# Patient Record
Sex: Female | Born: 1955
Health system: Southern US, Community
[De-identification: ages and names within clinical notes are randomized; demographics above are authoritative.]

## PROBLEM LIST (undated history)

## (undated) DIAGNOSIS — K760 Fatty (change of) liver, not elsewhere classified: Secondary | ICD-10-CM

## (undated) DIAGNOSIS — S92902A Unspecified fracture of left foot, initial encounter for closed fracture: Secondary | ICD-10-CM

## (undated) DIAGNOSIS — E1165 Type 2 diabetes mellitus with hyperglycemia: Secondary | ICD-10-CM

## (undated) DIAGNOSIS — K219 Gastro-esophageal reflux disease without esophagitis: Secondary | ICD-10-CM

## (undated) DIAGNOSIS — E785 Hyperlipidemia, unspecified: Secondary | ICD-10-CM

## (undated) HISTORY — PX: BREAST BIOPSY: SHX20

## (undated) HISTORY — DX: Gastro-esophageal reflux disease without esophagitis: K21.9

## (undated) HISTORY — DX: Hyperlipidemia, unspecified: E78.5

## (undated) HISTORY — PX: TONSILLECTOMY: SUR1361

## (undated) HISTORY — DX: Type 2 diabetes mellitus with hyperglycemia: E11.65

## (undated) HISTORY — DX: Fatty (change of) liver, not elsewhere classified: K76.0

## (undated) HISTORY — DX: Unspecified fracture of left foot, initial encounter for closed fracture: S92.902A

---

## 1999-11-27 HISTORY — PX: BACK SURGERY: SHX140

## 2007-11-27 DIAGNOSIS — S92902A Unspecified fracture of left foot, initial encounter for closed fracture: Secondary | ICD-10-CM

## 2007-11-27 HISTORY — DX: Unspecified fracture of left foot, initial encounter for closed fracture: S92.902A

## 2009-04-26 ENCOUNTER — Ambulatory Visit: Payer: Self-pay | Admitting: Family Medicine

## 2009-04-26 DIAGNOSIS — K219 Gastro-esophageal reflux disease without esophagitis: Secondary | ICD-10-CM | POA: Insufficient documentation

## 2009-04-26 DIAGNOSIS — F329 Major depressive disorder, single episode, unspecified: Secondary | ICD-10-CM

## 2009-04-26 DIAGNOSIS — E785 Hyperlipidemia, unspecified: Secondary | ICD-10-CM | POA: Insufficient documentation

## 2009-04-26 DIAGNOSIS — G43009 Migraine without aura, not intractable, without status migrainosus: Secondary | ICD-10-CM | POA: Insufficient documentation

## 2009-04-26 DIAGNOSIS — F3289 Other specified depressive episodes: Secondary | ICD-10-CM | POA: Insufficient documentation

## 2009-04-27 ENCOUNTER — Ambulatory Visit: Payer: Self-pay | Admitting: Family Medicine

## 2009-04-27 LAB — CONVERTED CEMR LAB
LDL Cholesterol: 115 mg/dL
LDL Cholesterol: 115 mg/dL
LDL Cholesterol: 115 mg/dL
LDL Cholesterol: 115 mg/dL
LDL Cholesterol: 115 mg/dL

## 2009-05-03 LAB — CONVERTED CEMR LAB
ALT: 70 units/L — ABNORMAL HIGH (ref 0–35)
Basophils Relative: 2.1 % (ref 0.0–3.0)
Bilirubin, Direct: 0 mg/dL (ref 0.0–0.3)
Chloride: 109 meq/L (ref 96–112)
Cholesterol: 195 mg/dL (ref 0–200)
Creatinine, Ser: 0.6 mg/dL (ref 0.4–1.2)
Eosinophils Relative: 3.1 % (ref 0.0–5.0)
GFR calc non Af Amer: 110.71 mL/min (ref >60)
HCT: 38.2 % (ref 36.0–46.0)
HDL: 44.9 mg/dL (ref >39.00)
Hemoglobin: 12.9 g/dL (ref 12.0–15.0)
Lymphs Abs: 2.2 10*3/uL (ref 0.7–4.0)
MCV: 91.2 fL (ref 78.0–100.0)
Monocytes Absolute: 0.4 10*3/uL (ref 0.1–1.0)
Monocytes Relative: 5.8 % (ref 3.0–12.0)
Neutro Abs: 4.6 10*3/uL (ref 1.4–7.7)
Platelets: 380 10*3/uL (ref 150.0–400.0)
Potassium: 4.3 meq/L (ref 3.5–5.1)
TSH: 1.48 microintl units/mL (ref 0.35–5.50)
Total Bilirubin: 0.8 mg/dL (ref 0.3–1.2)
Total CHOL/HDL Ratio: 4
VLDL: 49.8 mg/dL — ABNORMAL HIGH (ref 0.0–40.0)
Vitamin B-12: 492 pg/mL (ref 211–911)
WBC: 7.6 10*3/uL (ref 4.5–10.5)

## 2009-05-05 ENCOUNTER — Encounter: Payer: Self-pay | Admitting: Family Medicine

## 2009-05-13 ENCOUNTER — Encounter (INDEPENDENT_AMBULATORY_CARE_PROVIDER_SITE_OTHER): Payer: Self-pay | Admitting: *Deleted

## 2009-05-13 LAB — HM MAMMOGRAPHY: HM Mammogram: NORMAL

## 2009-06-14 ENCOUNTER — Telehealth: Payer: Self-pay | Admitting: Family Medicine

## 2009-06-17 ENCOUNTER — Ambulatory Visit: Payer: Self-pay | Admitting: Family Medicine

## 2009-06-17 DIAGNOSIS — E559 Vitamin D deficiency, unspecified: Secondary | ICD-10-CM | POA: Insufficient documentation

## 2009-06-30 LAB — CONVERTED CEMR LAB: Vit D, 25-Hydroxy: 22 ng/mL — ABNORMAL LOW (ref 30–89)

## 2009-07-26 ENCOUNTER — Other Ambulatory Visit: Admission: RE | Admit: 2009-07-26 | Discharge: 2009-07-26 | Payer: Self-pay | Admitting: Family Medicine

## 2009-07-26 ENCOUNTER — Ambulatory Visit: Payer: Self-pay | Admitting: Family Medicine

## 2009-07-26 ENCOUNTER — Encounter: Payer: Self-pay | Admitting: Family Medicine

## 2009-07-26 DIAGNOSIS — M25539 Pain in unspecified wrist: Secondary | ICD-10-CM | POA: Insufficient documentation

## 2009-07-26 LAB — HM PAP SMEAR

## 2009-08-02 ENCOUNTER — Encounter (INDEPENDENT_AMBULATORY_CARE_PROVIDER_SITE_OTHER): Payer: Self-pay | Admitting: *Deleted

## 2009-10-05 ENCOUNTER — Encounter: Payer: Self-pay | Admitting: Family Medicine

## 2009-11-02 ENCOUNTER — Encounter: Payer: Self-pay | Admitting: Family Medicine

## 2009-11-02 ENCOUNTER — Ambulatory Visit: Payer: Self-pay | Admitting: Unknown Physician Specialty

## 2009-11-02 LAB — HM COLONOSCOPY

## 2009-11-03 ENCOUNTER — Encounter (INDEPENDENT_AMBULATORY_CARE_PROVIDER_SITE_OTHER): Payer: Self-pay | Admitting: *Deleted

## 2009-11-08 ENCOUNTER — Telehealth: Payer: Self-pay | Admitting: Family Medicine

## 2010-01-25 ENCOUNTER — Ambulatory Visit: Payer: Self-pay | Admitting: Family Medicine

## 2010-01-26 LAB — CONVERTED CEMR LAB
ALT: 79 units/L — ABNORMAL HIGH (ref 0–35)
LDL Cholesterol: 72 mg/dL (ref 0–99)
Total CHOL/HDL Ratio: 3
Triglycerides: 142 mg/dL (ref 0.0–149.0)

## 2011-05-13 ENCOUNTER — Other Ambulatory Visit: Payer: Self-pay | Admitting: Family Medicine

## 2011-07-05 ENCOUNTER — Ambulatory Visit: Payer: Self-pay | Admitting: Podiatry

## 2011-08-31 ENCOUNTER — Other Ambulatory Visit: Payer: Self-pay | Admitting: Family Medicine

## 2011-08-31 NOTE — Telephone Encounter (Signed)
Not seen since 2010.Marland Kitchen Needs appt for CPX before refills... If this is made given refills until appt.

## 2011-09-03 ENCOUNTER — Other Ambulatory Visit: Payer: Self-pay | Admitting: *Deleted

## 2011-09-03 NOTE — Telephone Encounter (Signed)
Ok to refill for 1 month, but schedule f/u with Dr. B in the next month

## 2011-09-03 NOTE — Telephone Encounter (Signed)
Received faxed refill request from pharmacy for Escitalopram 10 mg , take one tablet by mouth once a day. Patient has not been seen in over a year.

## 2011-09-04 NOTE — Telephone Encounter (Signed)
Agreed.. Schedule a CPX if needed though (if no GYN) instead of a follow up.

## 2011-09-07 MED ORDER — ESCITALOPRAM OXALATE 10 MG PO TABS: 10.0000 mg | ORAL_TABLET | Freq: Every day | ORAL | Status: DC

## 2011-09-07 NOTE — Telephone Encounter (Signed)
Patient to call and schedule follow up when the gets to work refill 30 with 1

## 2011-10-01 ENCOUNTER — Telehealth: Payer: Self-pay | Admitting: Family Medicine

## 2011-10-01 DIAGNOSIS — E559 Vitamin D deficiency, unspecified: Secondary | ICD-10-CM

## 2011-10-01 DIAGNOSIS — E785 Hyperlipidemia, unspecified: Secondary | ICD-10-CM

## 2011-10-01 NOTE — Telephone Encounter (Signed)
Message copied by Excell Seltzer on Mon Oct 01, 2011 11:31 AM ------      Message from: Alvina Chou      Created: Fri Sep 28, 2011  5:12 PM      Regarding: labs for Tuesday, 11.6       Patient is scheduled for CPX labs, please order future labs, Thanks , Camelia Eng

## 2011-10-02 ENCOUNTER — Other Ambulatory Visit: Payer: Self-pay

## 2011-10-03 ENCOUNTER — Other Ambulatory Visit (INDEPENDENT_AMBULATORY_CARE_PROVIDER_SITE_OTHER): Payer: Managed Care, Other (non HMO)

## 2011-10-03 ENCOUNTER — Encounter: Payer: Self-pay | Admitting: Family Medicine

## 2011-10-03 DIAGNOSIS — E785 Hyperlipidemia, unspecified: Secondary | ICD-10-CM

## 2011-10-03 DIAGNOSIS — E559 Vitamin D deficiency, unspecified: Secondary | ICD-10-CM

## 2011-10-03 LAB — COMPREHENSIVE METABOLIC PANEL
AST: 64 U/L — ABNORMAL HIGH (ref 0–37)
Albumin: 4.1 g/dL (ref 3.5–5.2)
Alkaline Phosphatase: 97 U/L (ref 39–117)
Glucose, Bld: 102 mg/dL — ABNORMAL HIGH (ref 70–99)
Potassium: 4.2 mEq/L (ref 3.5–5.1)
Sodium: 139 mEq/L (ref 135–145)
Total Bilirubin: 0.7 mg/dL (ref 0.3–1.2)
Total Protein: 7.5 g/dL (ref 6.0–8.3)

## 2011-10-03 LAB — LIPID PANEL
Cholesterol: 215 mg/dL — ABNORMAL HIGH (ref 0–200)
Total CHOL/HDL Ratio: 4
VLDL: 35.6 mg/dL (ref 0.0–40.0)

## 2011-10-04 ENCOUNTER — Ambulatory Visit (INDEPENDENT_AMBULATORY_CARE_PROVIDER_SITE_OTHER): Payer: Managed Care, Other (non HMO) | Admitting: Family Medicine

## 2011-10-04 ENCOUNTER — Encounter: Payer: Self-pay | Admitting: Family Medicine

## 2011-10-04 ENCOUNTER — Other Ambulatory Visit (HOSPITAL_COMMUNITY)
Admission: RE | Admit: 2011-10-04 | Discharge: 2011-10-04 | Disposition: A | Discharge status: Home / Self Care | Payer: Managed Care, Other (non HMO) | Source: Ambulatory Visit | Attending: Family Medicine | Admitting: Family Medicine

## 2011-10-04 VITALS — BP 140/90 | HR 71 | Temp 97.8°F | Ht 62.0 in | Wt 171.8 lb

## 2011-10-04 DIAGNOSIS — R7303 Prediabetes: Secondary | ICD-10-CM

## 2011-10-04 DIAGNOSIS — E785 Hyperlipidemia, unspecified: Secondary | ICD-10-CM

## 2011-10-04 DIAGNOSIS — E1165 Type 2 diabetes mellitus with hyperglycemia: Secondary | ICD-10-CM | POA: Insufficient documentation

## 2011-10-04 DIAGNOSIS — Z01419 Encounter for gynecological examination (general) (routine) without abnormal findings: Secondary | ICD-10-CM

## 2011-10-04 DIAGNOSIS — F3289 Other specified depressive episodes: Secondary | ICD-10-CM

## 2011-10-04 DIAGNOSIS — Z1159 Encounter for screening for other viral diseases: Secondary | ICD-10-CM | POA: Insufficient documentation

## 2011-10-04 DIAGNOSIS — R209 Unspecified disturbances of skin sensation: Secondary | ICD-10-CM

## 2011-10-04 DIAGNOSIS — R7309 Other abnormal glucose: Secondary | ICD-10-CM

## 2011-10-04 DIAGNOSIS — F329 Major depressive disorder, single episode, unspecified: Secondary | ICD-10-CM

## 2011-10-04 DIAGNOSIS — Z1231 Encounter for screening mammogram for malignant neoplasm of breast: Secondary | ICD-10-CM

## 2011-10-04 DIAGNOSIS — E559 Vitamin D deficiency, unspecified: Secondary | ICD-10-CM

## 2011-10-04 DIAGNOSIS — R200 Anesthesia of skin: Secondary | ICD-10-CM | POA: Insufficient documentation

## 2011-10-04 DIAGNOSIS — J309 Allergic rhinitis, unspecified: Secondary | ICD-10-CM | POA: Insufficient documentation

## 2011-10-04 DIAGNOSIS — R7989 Other specified abnormal findings of blood chemistry: Secondary | ICD-10-CM | POA: Insufficient documentation

## 2011-10-04 DIAGNOSIS — Z23 Encounter for immunization: Secondary | ICD-10-CM

## 2011-10-04 DIAGNOSIS — R413 Other amnesia: Secondary | ICD-10-CM | POA: Insufficient documentation

## 2011-10-04 HISTORY — DX: Type 2 diabetes mellitus with hyperglycemia: E11.65

## 2011-10-04 LAB — VITAMIN D 25 HYDROXY (VIT D DEFICIENCY, FRACTURES): Vit D, 25-Hydroxy: 45 ng/mL (ref 30–89)

## 2011-10-04 MED ORDER — FLUTICASONE PROPIONATE 50 MCG/ACT NA SUSP: 2.0000 | Freq: Every day | NASAL | Status: DC

## 2011-10-04 NOTE — Assessment & Plan Note (Signed)
?   Due to lexapro.  Wil eval with labs for secondary causes. Consider MMSE next appt in 3 months

## 2011-10-04 NOTE — Assessment & Plan Note (Signed)
Will eval with hepatitis panel. Hold statin since trending up to 3 x normal.  No ETOH, no tylenol. Recheck in 2 weeks. Consider abd [place] at that time.

## 2011-10-04 NOTE — Patient Instructions (Addendum)
Hold lipitor. Return for hepatitis panel and liver test recheck in 2 weeks. We will also check labs to eval numbness and memory issues. Try to limit carbohydrates, sweets, work on low fat/low cholesterol diet. Return to regular exercise.Marland Kitchen 3-5 times a week Stop by front desk to set up Mammogram with Shirlee Limerick.  Trail of fluticasone nasal to help with  Sinus issues , ear fullness and cough spells.  Will evaluate further if not improving.

## 2011-10-04 NOTE — Assessment & Plan Note (Signed)
Resolved with supplementation 

## 2011-10-04 NOTE — Progress Notes (Signed)
Subjective:    Patient ID: Francille Wittmann, female    DOB: 1956-02-01, 55 y.o.   MRN: [id number]  HPI The patient is here for annual wellness exam and preventative care.    Depression, well controlled on lexapro. Stress reduced with job change.  Elevated Cholesterol: On lipitor... Has been off for 2 weeks.  LDL far from goal < 130.. Much higher than last year when taking med regularly. Using medications without problems: ? Elevated LFTS. Muscle aches: None Diet compliance:Moderate Exercise:None  Due to left foot issues.. Jone's fracture, achilles tendonitis.. Was in [place] since August. Other complaints: BP borderline high today.  Vit D def: resolved.  Elevated liver transaminases: trending up over last year. On 80 mg daily of statin medicaiton.  No ETOH use. No tylenol use.  Father with cirrhosis from ETOH and possible hepatitis.   Right thigh lateral,.. Numbness, burning noted in last 2 months. Intermittant.. Occurs in with standing in particular way. Hx of lumbar spine surgery for herniated disc in 2005.  o recent falls, or injuries  B popping in ears and muffled sounds. No pain in ears. Off and on sinus pain and pressure. Sneezing, nasal congestion. Nonsmoker. Occ coughing fits after feeling of catch in throat. Feels fine after coughing spell. No shortness of breath.  No chest pain  Has noted decline in memory in past few years. No dementia.  Review of Systems  Constitutional: Negative for fever, fatigue and unexpected weight change.  HENT: Positive for congestion, rhinorrhea, sneezing and postnasal drip. Negative for ear pain, sore throat, trouble swallowing, neck pain and sinus pressure.   Eyes: Negative for pain and itching.  Respiratory: Negative for cough, shortness of breath and wheezing.   Cardiovascular: Negative for chest pain, palpitations and leg swelling.  Gastrointestinal: Negative for nausea, abdominal pain, diarrhea, constipation and blood in stool.    Genitourinary: Negative for dysuria, hematuria, vaginal discharge, difficulty urinating and menstrual problem.       Some urinary incontinence with cough.  Skin: Negative for rash.  Neurological: Negative for syncope, weakness, light-headedness, numbness and headaches.  Psychiatric/Behavioral: Negative for confusion and dysphoric mood. The patient is not nervous/anxious.        Objective:   Physical Exam  Constitutional: Vital signs are normal. She appears well-developed and well-nourished. She is cooperative.  Non-toxic appearance. She does not appear ill. No distress.  HENT:  Head: Normocephalic.  Right Ear: Hearing, tympanic membrane, external ear and ear canal normal. No foreign bodies. Tympanic membrane is not erythematous and not bulging. No decreased hearing is noted.  Left Ear: Hearing, tympanic membrane, external ear and ear canal normal. No foreign bodies. Tympanic membrane is not erythematous and not bulging. No decreased hearing is noted.  Nose: Mucosal edema and rhinorrhea present. Right sinus exhibits no maxillary sinus tenderness and no frontal sinus tenderness. Left sinus exhibits no maxillary sinus tenderness and no frontal sinus tenderness.       Clear fluid B TMs  Eyes: Conjunctivae, EOM and lids are normal. Pupils are equal, round, and reactive to light. No foreign bodies found.  Neck: Trachea normal and normal range of motion. Neck supple. Carotid bruit is not present. No mass and no thyromegaly present.  Cardiovascular: Normal rate, regular rhythm, S1 normal, S2 normal, normal heart sounds and intact distal pulses.  Exam reveals no gallop.   No murmur heard. Pulmonary/Chest: Effort normal and breath sounds normal. No respiratory distress. She has no wheezes. She has no rhonchi. She has no rales.  Abdominal: Soft. Normal appearance and bowel sounds are normal. She exhibits no distension, no fluid wave, no abdominal bruit and no mass. There is no hepatosplenomegaly. There  is no tenderness. There is no rebound, no guarding and no CVA tenderness. No hernia.  Genitourinary: Vagina normal and uterus normal. No breast swelling, tenderness, discharge or bleeding. Pelvic exam was performed with patient prone. There is no rash, tenderness or lesion on the right labia. There is no rash, tenderness or lesion on the left labia. Uterus is not enlarged and not tender. Cervix exhibits no motion tenderness, no discharge and no friability. Right adnexum displays no mass, no tenderness and no fullness. Left adnexum displays no mass, no tenderness and no fullness.       PAP and DVE performed.  Lymphadenopathy:    She has no cervical adenopathy.    She has no axillary adenopathy.  Neurological: She is alert. She has normal strength. No cranial nerve deficit or sensory deficit.  Skin: Skin is warm, dry and intact. No rash noted.  Psychiatric: Her speech is normal and behavior is normal. Judgment normal. Her mood appears not anxious. Cognition and memory are normal. She does not exhibit a depressed mood.          Assessment & Plan:  The patient's preventative maintenance and recommended screening tests for an annual wellness exam were reviewed in full today. Brought up to date unless services declined.  Counselled on the importance of diet, exercise, and its role in overall health and mortality. The patient's FH and SH was reviewed, including their home life, tobacco status, and drug and alcohol status.   Mammogram: last in 2010, due. PAP/DVE: last pap 2010, neg, on q 2 year  Pap schedule, perform this year then space to q 3 years. DVE yearly. Colonoscopy: 10/2009, Dr. Markham Jordan, no polyps, repeat in 10 years. Vaccines: up to date with Td,  ZOX:WRUEA today. Mom with osteoporosis dx age 34...otherwise low risk.. May not be postmenopausal completely yet.

## 2011-10-04 NOTE — Assessment & Plan Note (Signed)
Poor control, but due to elevated LFTs trending up. Holding statin. Once LFTs evaluated... Will need to consider restarting low dose or treating differently.  In meantime will work on lifestyle changes.

## 2011-10-04 NOTE — Assessment & Plan Note (Addendum)
Eval B12.  ? Due to past back issues vs my parasthetica

## 2011-10-04 NOTE — Assessment & Plan Note (Signed)
And eustacian tiube dysfunction B.  trial of nasal steroid spray.  Likley post nasal drip also contributing to coughing spells. COnsider further eval if not improving,

## 2011-10-04 NOTE — Assessment & Plan Note (Signed)
Discussed diet and lifestyle changes

## 2011-10-04 NOTE — Assessment & Plan Note (Signed)
Stable control, she feels like she needs to continue this med.

## 2011-10-11 ENCOUNTER — Encounter: Payer: Self-pay | Admitting: *Deleted

## 2011-10-17 ENCOUNTER — Other Ambulatory Visit (INDEPENDENT_AMBULATORY_CARE_PROVIDER_SITE_OTHER): Payer: Managed Care, Other (non HMO)

## 2011-10-17 ENCOUNTER — Other Ambulatory Visit: Payer: Managed Care, Other (non HMO)

## 2011-10-17 DIAGNOSIS — R7989 Other specified abnormal findings of blood chemistry: Secondary | ICD-10-CM

## 2011-10-17 DIAGNOSIS — R413 Other amnesia: Secondary | ICD-10-CM

## 2011-10-17 LAB — HEPATIC FUNCTION PANEL
Albumin: 4 g/dL (ref 3.5–5.2)
Total Protein: 7.6 g/dL (ref 6.0–8.3)

## 2011-10-18 LAB — RPR

## 2011-10-19 LAB — HEPATITIS PANEL, ACUTE
Hep A IgM: NEGATIVE
Hep B C IgM: NEGATIVE
Hepatitis B Surface Ag: NEGATIVE

## 2011-10-19 LAB — VITAMIN B12: Vitamin B-12: 484 pg/mL (ref 211–911)

## 2011-11-06 ENCOUNTER — Other Ambulatory Visit: Payer: Self-pay | Admitting: Family Medicine

## 2012-01-04 ENCOUNTER — Ambulatory Visit: Payer: Managed Care, Other (non HMO) | Admitting: Family Medicine

## 2012-01-07 ENCOUNTER — Ambulatory Visit: Payer: Managed Care, Other (non HMO) | Admitting: Family Medicine

## 2012-01-16 ENCOUNTER — Ambulatory Visit (INDEPENDENT_AMBULATORY_CARE_PROVIDER_SITE_OTHER): Payer: Managed Care, Other (non HMO) | Admitting: Family Medicine

## 2012-01-16 ENCOUNTER — Encounter: Payer: Self-pay | Admitting: Family Medicine

## 2012-01-16 VITALS — BP 130/78 | HR 69 | Temp 98.1°F | Ht 62.0 in | Wt 166.8 lb

## 2012-01-16 DIAGNOSIS — F3289 Other specified depressive episodes: Secondary | ICD-10-CM

## 2012-01-16 DIAGNOSIS — R945 Abnormal results of liver function studies: Secondary | ICD-10-CM

## 2012-01-16 DIAGNOSIS — F329 Major depressive disorder, single episode, unspecified: Secondary | ICD-10-CM

## 2012-01-16 DIAGNOSIS — R413 Other amnesia: Secondary | ICD-10-CM

## 2012-01-16 DIAGNOSIS — R7989 Other specified abnormal findings of blood chemistry: Secondary | ICD-10-CM

## 2012-01-16 DIAGNOSIS — J309 Allergic rhinitis, unspecified: Secondary | ICD-10-CM

## 2012-01-16 NOTE — Patient Instructions (Signed)
REFERRAL: GO THE THE FRONT ROOM AT THE ENTRANCE OF OUR CLINIC, NEAR CHECK IN. ASK FOR MARION. SHE WILL HELP YOU SET UP YOUR REFERRAL. DATE: TIME:  

## 2012-01-16 NOTE — Progress Notes (Signed)
  Patient Name: Karla Peterson Date of Birth: Feb 23, 1956 Age: 56 y.o. Medical Record Number: [id number] Gender: female Date of Encounter: 01/16/2012  History of Present Illness:  Karla Peterson is a 56 y.o. very pleasant female patient who presents with the following:  U/s to eval liver Elevated LFT's Hepatitis serologies Lab Results  Component Value Date   HEPAIGM NEG 10/17/2011   HEPBIGM NEG 10/17/2011  C neg  No prior u/s eval   Depression, on Lexapro. Doing better than she was. 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   Memory question: her last office visit, the patient brought the potential possibility that she was having some memory difficulties.  Having some headaches --- has had four or five in the last 2 weeks and they will come and go. Maybe some itchy eyes. ? If vision change. It also is noticing her allergies have been flared up of the last one to 2 weeks.  Takes Vitamin E 1000 units. (Stop) Milk thistle and some other vitamins. Taking Vit D also. Also takes a multivitamin.   Past Medical History, Surgical History, Social History, Family History, Problem List, Medications, and Allergies have been reviewed and updated if relevant.  Review of Systems:  GEN: No acute illnesses, no fevers, chills. GI: No n/v/d, eating normally Pulm: No SOB Interactive and getting along well at home.  Otherwise, ROS is as per the HPI.   Physical Examination: Filed Vitals:   01/16/12 1234  BP: 130/78  Pulse: 69  Temp: 98.1 F (36.7 C)  TempSrc: Oral  Height: 5\' 2"  (1.575 m)  Weight: 166 lb 12.8 oz (75.66 kg)  SpO2: 98%    Body mass index is 30.51 kg/(m^2).   GEN: WDWN, NAD, Non-toxic, A & O x 3 HEENT: Atraumatic, Normocephalic. Neck supple. No masses, No LAD. Ears and Nose: No external deformity. CV: RRR, No M/G/R. No JVD. No thrill. No extra heart sounds. PULM: CTA B, no wheezes, crackles, rhonchi. No retractions. No resp. distress.  No accessory muscle use. EXTR: No c/c/e NEURO Normal gait.  PSYCH: Normally interactive. Conversant. Not depressed or anxious appearing.  Calm demeanor.    Folstein 27/30. She was a few subtraction questions  Assessment and Plan: 1. Abnormal LFTs  US Abdomen Complete  2. Memory loss    3. Allergic rhinitis    4. DEPRESSION    5. Elevated LFTs      Lab Results  Component Value Date   ALT 102* 10/17/2011   AST 56* 10/17/2011   ALKPHOS 84 10/17/2011   BILITOT 0.5 10/17/2011   Persistently abnormal liver function test. This is been ongoing for more than a year. Obtain abdominal ultrasound to evaluate for potential fatty liver disease, or other occult internal derangement or abnormality of the liver.  Memory loss: Her Mini-Mental status exam is within normal. A few points off, but these are with serial 7 subtraction, and it is not uncommon for individuals to this a few of these specific questions based on her ability with mathematics.  Depression is stable.  Headaches, I suspect are due to some allergy flare. Recommended antihistamines.

## 2012-01-18 ENCOUNTER — Other Ambulatory Visit: Payer: Self-pay | Admitting: Family Medicine

## 2012-01-25 ENCOUNTER — Ambulatory Visit: Payer: Self-pay | Admitting: Family Medicine

## 2012-01-25 ENCOUNTER — Encounter: Payer: Self-pay | Admitting: Family Medicine

## 2012-01-25 DIAGNOSIS — K760 Fatty (change of) liver, not elsewhere classified: Secondary | ICD-10-CM

## 2012-01-26 ENCOUNTER — Encounter: Payer: Self-pay | Admitting: Family Medicine

## 2012-01-26 DIAGNOSIS — K760 Fatty (change of) liver, not elsewhere classified: Secondary | ICD-10-CM | POA: Insufficient documentation

## 2012-01-26 HISTORY — DX: Fatty (change of) liver, not elsewhere classified: K76.0

## 2012-02-11 ENCOUNTER — Encounter: Payer: Self-pay | Admitting: Family Medicine

## 2012-02-11 ENCOUNTER — Ambulatory Visit: Payer: Self-pay | Admitting: Family Medicine

## 2012-02-18 ENCOUNTER — Ambulatory Visit: Payer: Self-pay | Admitting: Family Medicine

## 2012-02-20 ENCOUNTER — Encounter: Payer: Self-pay | Admitting: Family Medicine

## 2012-02-21 ENCOUNTER — Telehealth: Payer: Self-pay

## 2012-02-21 DIAGNOSIS — N63 Unspecified lump in unspecified breast: Secondary | ICD-10-CM

## 2012-02-21 NOTE — Telephone Encounter (Signed)
Pt is anxious to get results of recent US and mammogram. I advised pt Herbert Seta would contact pt with results after she gets results from Dr Ermalene Searing. Pt said she hates to go thru Easter not knowing. I explained I was not sure when Herbert Seta would get results but she would call pt at that time.

## 2012-02-21 NOTE — Telephone Encounter (Signed)
Discussed u/s and mammo results with her -- complex cystic structure vs solid nodule with need for tissue diagnosis. Will consult general surgery

## 2012-02-22 ENCOUNTER — Other Ambulatory Visit: Payer: Self-pay | Admitting: Family Medicine

## 2012-02-25 NOTE — Telephone Encounter (Signed)
Surgery Consult made with Dr Evette Cristal with [facility], patient called and aware of appt.

## 2012-05-06 ENCOUNTER — Telehealth: Payer: Self-pay | Admitting: Family Medicine

## 2012-05-06 NOTE — Telephone Encounter (Signed)
Caller: Karla Peterson/Patient; PCP: Excell Seltzer.; CB#: [phone];IONG regarding Pulled Something At Right Breast; States she picked up some heavy boxes and felt some sharp pain afterwards.  Pain intermittent; "something in there doesn't feel right."  Pain rated at 2 of 10.  She sounds anxious about this and going on vacation soon.    Emergent sx ruled out. Home care and parameters for callback per Breast Sx. protocol.  Caller agreed to see provider iwithin 72 hours.  Appointment with Dr. Patsy Lager on 05/07/12 @ 10:15.

## 2012-05-07 ENCOUNTER — Ambulatory Visit: Payer: Managed Care, Other (non HMO) | Admitting: Family Medicine

## 2012-06-27 ENCOUNTER — Other Ambulatory Visit: Payer: Self-pay | Admitting: Family Medicine

## 2012-07-15 ENCOUNTER — Ambulatory Visit: Payer: Managed Care, Other (non HMO) | Admitting: Family Medicine

## 2012-09-11 ENCOUNTER — Other Ambulatory Visit: Payer: Self-pay | Admitting: Family Medicine

## 2012-09-11 NOTE — Telephone Encounter (Signed)
Pt called and left v/m requesting rx's be refilled until she is able to come in and be seen.

## 2012-09-12 NOTE — Telephone Encounter (Signed)
Advised patient, she will call back to schedule physical in November or shortly after.

## 2012-09-12 NOTE — Telephone Encounter (Signed)
Due for CPx in 09/2012. Okay to refill med, but pt needs to make appt for CPX. Refill only 30 days with one refill please.

## 2012-09-12 NOTE — Telephone Encounter (Signed)
Again she needs to set down a specific appt date and you can refill until that specific date. Can be up to 3 months away.

## 2012-09-16 NOTE — Telephone Encounter (Signed)
Pt has already scheduled CPX 12/26/12.

## 2012-12-19 ENCOUNTER — Other Ambulatory Visit: Payer: Managed Care, Other (non HMO)

## 2012-12-19 ENCOUNTER — Telehealth: Payer: Self-pay | Admitting: Family Medicine

## 2012-12-19 DIAGNOSIS — R7303 Prediabetes: Secondary | ICD-10-CM

## 2012-12-19 DIAGNOSIS — E559 Vitamin D deficiency, unspecified: Secondary | ICD-10-CM

## 2012-12-19 DIAGNOSIS — K760 Fatty (change of) liver, not elsewhere classified: Secondary | ICD-10-CM

## 2012-12-19 DIAGNOSIS — E785 Hyperlipidemia, unspecified: Secondary | ICD-10-CM

## 2012-12-19 NOTE — Telephone Encounter (Signed)
Message copied by Kerby Nora E on Fri Dec 19, 2012  8:18 AM ------      Message from: Baldomero Lamy      Created: Wed Dec 10, 2012 11:48 AM      Regarding: Cpx labs Fri 1/24       Please order  future cpx labs for pt's upcoming lab appt.      Thanks      Rodney Booze

## 2012-12-26 ENCOUNTER — Encounter: Payer: Managed Care, Other (non HMO) | Admitting: Family Medicine

## 2013-02-03 ENCOUNTER — Other Ambulatory Visit: Payer: Managed Care, Other (non HMO)

## 2013-02-04 ENCOUNTER — Other Ambulatory Visit (INDEPENDENT_AMBULATORY_CARE_PROVIDER_SITE_OTHER): Payer: Managed Care, Other (non HMO)

## 2013-02-04 DIAGNOSIS — R7309 Other abnormal glucose: Secondary | ICD-10-CM

## 2013-02-04 DIAGNOSIS — K760 Fatty (change of) liver, not elsewhere classified: Secondary | ICD-10-CM

## 2013-02-04 DIAGNOSIS — R7303 Prediabetes: Secondary | ICD-10-CM

## 2013-02-04 DIAGNOSIS — E785 Hyperlipidemia, unspecified: Secondary | ICD-10-CM

## 2013-02-04 DIAGNOSIS — E559 Vitamin D deficiency, unspecified: Secondary | ICD-10-CM

## 2013-02-04 DIAGNOSIS — K7689 Other specified diseases of liver: Secondary | ICD-10-CM

## 2013-02-04 LAB — LIPID PANEL
HDL: 44.4 mg/dL (ref >39.00)
LDL Cholesterol: 83 mg/dL (ref 0–99)
Total CHOL/HDL Ratio: 3
Triglycerides: 123 mg/dL (ref 0.0–149.0)
VLDL: 24.6 mg/dL (ref 0.0–40.0)

## 2013-02-04 LAB — COMPREHENSIVE METABOLIC PANEL
ALT: 89 U/L — ABNORMAL HIGH (ref 0–35)
AST: 56 U/L — ABNORMAL HIGH (ref 0–37)
Alkaline Phosphatase: 91 U/L (ref 39–117)
BUN: 16 mg/dL (ref 6–23)
Creatinine, Ser: 0.6 mg/dL (ref 0.4–1.2)

## 2013-02-10 ENCOUNTER — Ambulatory Visit (INDEPENDENT_AMBULATORY_CARE_PROVIDER_SITE_OTHER): Payer: Managed Care, Other (non HMO) | Admitting: Family Medicine

## 2013-02-10 ENCOUNTER — Encounter: Payer: Self-pay | Admitting: Family Medicine

## 2013-02-10 VITALS — BP 130/88 | HR 72 | Temp 98.6°F | Ht 62.0 in | Wt 162.0 lb

## 2013-02-10 DIAGNOSIS — Z0000 Encounter for general adult medical examination without abnormal findings: Secondary | ICD-10-CM

## 2013-02-10 DIAGNOSIS — K7689 Other specified diseases of liver: Secondary | ICD-10-CM

## 2013-02-10 DIAGNOSIS — E785 Hyperlipidemia, unspecified: Secondary | ICD-10-CM

## 2013-02-10 DIAGNOSIS — R7309 Other abnormal glucose: Secondary | ICD-10-CM

## 2013-02-10 DIAGNOSIS — K760 Fatty (change of) liver, not elsewhere classified: Secondary | ICD-10-CM

## 2013-02-10 DIAGNOSIS — F329 Major depressive disorder, single episode, unspecified: Secondary | ICD-10-CM

## 2013-02-10 DIAGNOSIS — F3289 Other specified depressive episodes: Secondary | ICD-10-CM

## 2013-02-10 DIAGNOSIS — R7303 Prediabetes: Secondary | ICD-10-CM

## 2013-02-10 MED ORDER — ESCITALOPRAM OXALATE 10 MG PO TABS: ORAL_TABLET | ORAL | Status: DC

## 2013-02-10 MED ORDER — ATORVASTATIN CALCIUM 80 MG PO TABS: ORAL_TABLET | ORAL | Status: DC

## 2013-02-10 MED ORDER — SUMATRIPTAN SUCCINATE 100 MG PO TABS: ORAL_TABLET | ORAL | Status: AC

## 2013-02-10 MED ORDER — PANTOPRAZOLE SODIUM 40 MG PO TBEC: DELAYED_RELEASE_TABLET | ORAL | Status: DC

## 2013-02-10 NOTE — Patient Instructions (Addendum)
Work on healthy eating, low carb, exercise increase and weight loss.  Decrease potatos, pasta, breads, sweets. Avoid sweetened drinks.  Call to schedule mammogram on your own, [facility].

## 2013-02-10 NOTE — Addendum Note (Signed)
Addended by: Eliezer Bottom on: 02/10/2013 12:54 PM   Modules accepted: Orders

## 2013-02-10 NOTE — Progress Notes (Signed)
The patient is here for annual wellness exam and preventative care.   Depression, well controlled on lexapro. Stress reduced with job change. Denies SI, HI. She feels she still needs this.. Was unable to come off .  Elevated Cholesterol: On lipitor LDL at goal < 130. Lab Results  Component Value Date   CHOL 152 02/04/2013   HDL 44.40 02/04/2013   LDLCALC 83 02/04/2013   LDLDIRECT 154.6 10/03/2011   TRIG 123.0 02/04/2013   CHOLHDL 3 02/04/2013  Using medications without problems: ? Elevated LFTS.  Muscle aches: None  Diet compliance:Moderate, improving. Exercise:Walking, trying to bicycle Hx of  left foot issues.. Jone's fracture, achilles tendonitis.. Was in [place] since August.  Other complaints:   Wt Readings from Last 3 Encounters:  02/10/13 162 lb (73.483 kg)  01/16/12 166 lb 12.8 oz (75.66 kg)  10/04/11 171 lb 12.8 oz (77.928 kg)   Prediabetes: increasing some in past years.  Vit D def: resolved.   Elevated liver transaminases:  Was trending up over  on 80 mg daily of statin medicaiton.  Stable this year and trending back down. No ETOH use. No tylenol use.  Father with cirrhosis from ETOH and possible hepatitis.    Review of Systems  Constitutional: Negative for fever, fatigue and unexpected weight change.  HENT: negative for congestion, rhinorrhea, sneezing and postnasal drip. Negative for ear pain, sore throat, trouble swallowing, neck pain and sinus pressure.  Eyes: Negative for pain and itching.  Respiratory: Negative for cough, shortness of breath and wheezing.  Cardiovascular: Negative for chest pain, palpitations and leg swelling.  Gastrointestinal: Negative for nausea, abdominal pain, diarrhea, constipation and blood in stool.  Genitourinary: Negative for dysuria, hematuria, vaginal discharge, difficulty urinating and menstrual problem.  Some urinary incontinence with cough.  Skin: Negative for rash.  Neurological: Negative for syncope, weakness, light-headedness,  numbness and headaches.  Psychiatric/Behavioral: Negative for confusion and dysphoric mood. The patient is not nervous/anxious.  Objective:   Physical Exam  Constitutional: Vital signs are normal. She appears well-developed and well-nourished. She is cooperative. Non-toxic appearance. She does not appear ill. No distress.  HENT:  Head: Normocephalic.  Right Ear: Hearing, tympanic membrane, external ear and ear canal normal. No foreign bodies. Tympanic membrane is not erythematous and not bulging. No decreased hearing is noted.  Left Ear: Hearing, tympanic membrane, external ear and ear canal normal. No foreign bodies. Tympanic membrane is not erythematous and not bulging. No decreased hearing is noted.  Nose: Mucosal edema and rhinorrhea present. Right sinus exhibits no maxillary sinus tenderness and no frontal sinus tenderness. Left sinus exhibits no maxillary sinus tenderness and no frontal sinus tenderness.  Clear fluid B TMs  Eyes: Conjunctivae, EOM and lids are normal. Pupils are equal, round, and reactive to light. No foreign bodies found.  Neck: Trachea normal and normal range of motion. Neck supple. Carotid bruit is not present. No mass and no thyromegaly present.  Cardiovascular: Normal rate, regular rhythm, S1 normal, S2 normal, normal heart sounds and intact distal pulses. Exam reveals no gallop.  No murmur heard.  Pulmonary/Chest: Effort normal and breath sounds normal. No respiratory distress. She has no wheezes. She has no rhonchi. She has no rales.  Abdominal: Soft. Normal appearance and bowel sounds are normal. She exhibits no distension, no fluid wave, no abdominal bruit and no mass. There is no hepatosplenomegaly. There is no tenderness. There is no rebound, no guarding and no CVA tenderness. No hernia.  Genitourinary: Vagina normal and uterus normal. No  breast swelling, tenderness, discharge or bleeding. Pelvic exam was performed with patient prone. There is no rash, tenderness or  lesion on the right labia. There is no rash, tenderness or lesion on the left labia. Uterus is not enlarged and not tender. Cervix exhibits no motion tenderness, no discharge and no friability. Right adnexum displays no mass, no tenderness and no fullness. Left adnexum displays no mass, no tenderness and no fullness.  PAP and DVE performed.  Lymphadenopathy:  She has no cervical adenopathy.  She has no axillary adenopathy.  Neurological: She is alert. She has normal strength. No cranial nerve deficit or sensory deficit.  Skin: Skin is warm, dry and intact. No rash noted.  Psychiatric: Her speech is normal and behavior is normal. Judgment normal. Her mood appears not anxious. Cognition and memory are normal. She does not exhibit a depressed mood.  Assessment & Plan:   The patient's preventative maintenance and recommended screening tests for an annual wellness exam were reviewed in full today.  Brought up to date unless services declined.  Counselled on the importance of diet, exercise, and its role in overall health and mortality.  The patient's FH and SH was reviewed, including their home life, tobacco status, and drug and alcohol status.   Mammogram:  due.  PAP/DVE: last pap 2012, neg, on q 3 years. DVE yearly.  Colonoscopy: 10/2009, Dr. Markham Jordan, no polyps, repeat in 10 years.  Vaccines: up to date with Td, ZOX:WRUEA today.  Mom with osteoporosis dx age 76...otherwise low risk.

## 2013-02-10 NOTE — Assessment & Plan Note (Signed)
Stable on lexapro.   

## 2013-02-10 NOTE — Assessment & Plan Note (Signed)
Counseled on low carb diet and lifestyle changes. Encouraged exercise, weight loss, healthy eating habits.

## 2013-02-10 NOTE — Assessment & Plan Note (Signed)
Improving with lifestyle changes. 

## 2013-02-10 NOTE — Assessment & Plan Note (Signed)
Well controlled. Continue current medication.  

## 2013-10-05 ENCOUNTER — Telehealth: Payer: Self-pay

## 2013-10-05 ENCOUNTER — Ambulatory Visit: Payer: Managed Care, Other (non HMO) | Admitting: Internal Medicine

## 2013-10-05 NOTE — Telephone Encounter (Signed)
Pt's face was red on 10/03/13 and on 10/04/13 red and itching; pt took benadryl and itching stopped but today face is still red and swollen; no difficulty breathing,no swelling in throat or tongue. Pt used a new face wash on Sat. Pt only used x 1. No new meds or foods. Pt scheduled 1st available appt today with Nicki Reaper, NP at 2 pm. If pt condition changes or worsens prior to appt pt will go to [facility].

## 2014-03-07 ENCOUNTER — Other Ambulatory Visit: Payer: Self-pay | Admitting: Family Medicine

## 2014-05-02 ENCOUNTER — Telehealth: Payer: Self-pay | Admitting: Family Medicine

## 2014-05-02 DIAGNOSIS — E559 Vitamin D deficiency, unspecified: Secondary | ICD-10-CM

## 2014-05-02 DIAGNOSIS — R7989 Other specified abnormal findings of blood chemistry: Secondary | ICD-10-CM

## 2014-05-02 DIAGNOSIS — K760 Fatty (change of) liver, not elsewhere classified: Secondary | ICD-10-CM

## 2014-05-02 DIAGNOSIS — R945 Abnormal results of liver function studies: Principal | ICD-10-CM

## 2014-05-02 DIAGNOSIS — E785 Hyperlipidemia, unspecified: Secondary | ICD-10-CM

## 2014-05-02 DIAGNOSIS — R7303 Prediabetes: Secondary | ICD-10-CM

## 2014-05-02 NOTE — Telephone Encounter (Signed)
Message copied by Excell Seltzer on Sun May 02, 2014 10:34 PM ------      Message from: Alvina Chou      Created: Fri Apr 30, 2014  8:39 AM      Regarding: Lab orders for Tuesday, 6.9.15       Patient is scheduled for CPX labs, please order future labs, Thanks , Terri       ------

## 2014-05-04 ENCOUNTER — Other Ambulatory Visit: Payer: Managed Care, Other (non HMO)

## 2014-05-06 ENCOUNTER — Other Ambulatory Visit (INDEPENDENT_AMBULATORY_CARE_PROVIDER_SITE_OTHER): Payer: Managed Care, Other (non HMO)

## 2014-05-06 DIAGNOSIS — R7309 Other abnormal glucose: Secondary | ICD-10-CM

## 2014-05-06 DIAGNOSIS — R7303 Prediabetes: Secondary | ICD-10-CM

## 2014-05-06 DIAGNOSIS — E559 Vitamin D deficiency, unspecified: Secondary | ICD-10-CM

## 2014-05-06 DIAGNOSIS — R7989 Other specified abnormal findings of blood chemistry: Secondary | ICD-10-CM

## 2014-05-06 DIAGNOSIS — K760 Fatty (change of) liver, not elsewhere classified: Secondary | ICD-10-CM

## 2014-05-06 DIAGNOSIS — K7689 Other specified diseases of liver: Secondary | ICD-10-CM

## 2014-05-06 DIAGNOSIS — E785 Hyperlipidemia, unspecified: Secondary | ICD-10-CM

## 2014-05-06 DIAGNOSIS — R945 Abnormal results of liver function studies: Secondary | ICD-10-CM

## 2014-05-06 LAB — LIPID PANEL
CHOLESTEROL: 134 mg/dL (ref 0–200)
HDL: 36 mg/dL — ABNORMAL LOW (ref >39.00)
LDL Cholesterol: 73 mg/dL (ref 0–99)
NonHDL: 98
Total CHOL/HDL Ratio: 4
Triglycerides: 127 mg/dL (ref 0.0–149.0)
VLDL: 25.4 mg/dL (ref 0.0–40.0)

## 2014-05-06 LAB — COMPREHENSIVE METABOLIC PANEL
ALK PHOS: 101 U/L (ref 39–117)
ALT: 125 U/L — ABNORMAL HIGH (ref 0–35)
AST: 108 U/L — ABNORMAL HIGH (ref 0–37)
Albumin: 3.8 g/dL (ref 3.5–5.2)
BILIRUBIN TOTAL: 0.3 mg/dL (ref 0.2–1.2)
BUN: 16 mg/dL (ref 6–23)
CO2: 29 mEq/L (ref 19–32)
Calcium: 8.9 mg/dL (ref 8.4–10.5)
Chloride: 104 mEq/L (ref 96–112)
Creatinine, Ser: 0.6 mg/dL (ref 0.4–1.2)
GFR: 115.36 mL/min (ref >60.00)
GLUCOSE: 137 mg/dL — AB (ref 70–99)
Potassium: 4.1 mEq/L (ref 3.5–5.1)
SODIUM: 140 meq/L (ref 135–145)
TOTAL PROTEIN: 7.3 g/dL (ref 6.0–8.3)

## 2014-05-06 LAB — HEMOGLOBIN A1C: Hgb A1c MFr Bld: 7 % — ABNORMAL HIGH (ref 4.6–6.5)

## 2014-05-06 LAB — VITAMIN D 25 HYDROXY (VIT D DEFICIENCY, FRACTURES): VITD: 29.6 ng/mL

## 2014-05-11 ENCOUNTER — Encounter: Payer: Self-pay | Admitting: Family Medicine

## 2014-05-11 ENCOUNTER — Ambulatory Visit (INDEPENDENT_AMBULATORY_CARE_PROVIDER_SITE_OTHER): Payer: Managed Care, Other (non HMO) | Admitting: Family Medicine

## 2014-05-11 ENCOUNTER — Other Ambulatory Visit (HOSPITAL_COMMUNITY)
Admission: RE | Admit: 2014-05-11 | Discharge: 2014-05-11 | Disposition: A | Discharge status: Home / Self Care | Payer: Managed Care, Other (non HMO) | Source: Ambulatory Visit | Attending: Family Medicine | Admitting: Family Medicine

## 2014-05-11 VITALS — BP 130/80 | HR 72 | Temp 98.0°F | Ht 62.5 in | Wt 172.5 lb

## 2014-05-11 DIAGNOSIS — IMO0001 Reserved for inherently not codable concepts without codable children: Secondary | ICD-10-CM

## 2014-05-11 DIAGNOSIS — R7989 Other specified abnormal findings of blood chemistry: Secondary | ICD-10-CM

## 2014-05-11 DIAGNOSIS — R200 Anesthesia of skin: Secondary | ICD-10-CM | POA: Insufficient documentation

## 2014-05-11 DIAGNOSIS — R202 Paresthesia of skin: Secondary | ICD-10-CM

## 2014-05-11 DIAGNOSIS — R209 Unspecified disturbances of skin sensation: Secondary | ICD-10-CM

## 2014-05-11 DIAGNOSIS — R945 Abnormal results of liver function studies: Secondary | ICD-10-CM

## 2014-05-11 DIAGNOSIS — Z0000 Encounter for general adult medical examination without abnormal findings: Secondary | ICD-10-CM

## 2014-05-11 DIAGNOSIS — E785 Hyperlipidemia, unspecified: Secondary | ICD-10-CM

## 2014-05-11 DIAGNOSIS — E1165 Type 2 diabetes mellitus with hyperglycemia: Secondary | ICD-10-CM

## 2014-05-11 DIAGNOSIS — Z01419 Encounter for gynecological examination (general) (routine) without abnormal findings: Secondary | ICD-10-CM | POA: Insufficient documentation

## 2014-05-11 DIAGNOSIS — K7689 Other specified diseases of liver: Secondary | ICD-10-CM

## 2014-05-11 DIAGNOSIS — K760 Fatty (change of) liver, not elsewhere classified: Secondary | ICD-10-CM

## 2014-05-11 LAB — TSH: TSH: 2.06 u[IU]/mL (ref 0.35–4.50)

## 2014-05-11 LAB — VITAMIN B12: Vitamin B-12: 479 pg/mL (ref 211–911)

## 2014-05-11 MED ORDER — ESCITALOPRAM OXALATE 20 MG PO TABS: 20.0000 mg | ORAL_TABLET | Freq: Every day | ORAL | Status: DC

## 2014-05-11 NOTE — Assessment & Plan Note (Signed)
New diagnosis. Refer to nutritionsit.  Aggressive lifestyle changes. Follow up with A1C in 3 months..Marland Kitchen

## 2014-05-11 NOTE — Assessment & Plan Note (Signed)
?   Meralgia peresthetica vs secondary to DM vs B12 issues vs back issue. Will eval B12 and lower glucose. If not improved eval further.

## 2014-05-11 NOTE — Progress Notes (Signed)
The patient is here for annual wellness exam and preventative care.   She has noted numbness/ burning in right lateral leg off and on for 1 year. Has gotten more frequent. No numbness or burning in feet.  Slight  low back pain. She has chronic B calf ache. Past nml nerve conduction.  Depression,moderate controlled on lexapro. She would like to increase dose of lexapro. Denies SI, HI.  She feels she still needs this.. Was unable to come off .   Elevated Cholesterol: On lipitor LDL at goal < 130.   Lab Results  Component Value Date   CHOL 134 05/06/2014   HDL 36.00* 05/06/2014   LDLCALC 73 05/06/2014   LDLDIRECT 154.6 10/03/2011   TRIG 127.0 05/06/2014   CHOLHDL 4 05/06/2014  Using medications without problems: ? Elevated LFTS.  Muscle aches: None  Diet compliance:Moderate, improving.  Exercise:Walking, trying to bicycle  Other complaints:   Wt Readings from Last 3 Encounters:  05/11/14 172 lb 8 oz (78.245 kg)  02/10/13 162 lb (73.483 kg)  01/16/12 166 lb 12.8 oz (75.66 kg)   Prediabetes: increasing some in past years.   Now new diagnosis DM. Lab Results  Component Value Date   HGBA1C 7.0* 05/06/2014  She has been drinking a lot of sweet tea lately but in last month has stopped this. She been drinking water.  Vit D def: resolved.   Elevated liver transaminases: Was trending up over on 80 mg daily of statin medication.   She has not stopped it. No ETOH use. No tylenol use.  Fatty liver  On US in past. Nml hepatitis panel in 2012. Father with cirrhosis from ETOH and possible hepatitis.   Review of Systems  Constitutional: Negative for fever, fatigue and unexpected weight change.  HENT: negative for congestion, rhinorrhea, sneezing and postnasal drip. Negative for ear pain, sore throat, trouble swallowing, neck pain and sinus pressure.  Eyes: Negative for pain and itching.  Respiratory: Negative for cough, shortness of breath and wheezing.  Cardiovascular: Negative for chest  pain, palpitations and leg swelling.  Gastrointestinal: Negative for nausea, abdominal pain, diarrhea, constipation and blood in stool.  Genitourinary: Negative for dysuria, hematuria, vaginal discharge, difficulty urinating and menstrual problem.  Some urinary incontinence with cough.  Skin: Negative for rash.  Neurological: Negative for syncope, weakness, light-headedness, numbness and headaches.  Psychiatric/Behavioral: Negative for confusion and dysphoric mood. The patient is not nervous/anxious.  Objective:   Physical Exam  Constitutional: Vital signs are normal. She appears well-developed and well-nourished. She is cooperative. Non-toxic appearance. She does not appear ill. No distress.  HENT:  Head: Normocephalic.  Right Ear: Hearing, tympanic membrane, external ear and ear canal normal. No foreign bodies. Tympanic membrane is not erythematous and not bulging. No decreased hearing is noted.  Left Ear: Hearing, tympanic membrane, external ear and ear canal normal. No foreign bodies. Tympanic membrane is not erythematous and not bulging. No decreased hearing is noted.  Nose: Mucosal edema and rhinorrhea present. Right sinus exhibits no maxillary sinus tenderness and no frontal sinus tenderness. Left sinus exhibits no maxillary sinus tenderness and no frontal sinus tenderness.    Eyes: Conjunctivae, EOM and lids are normal. Pupils are equal, round, and reactive to light. No foreign bodies found.  Neck: Trachea normal and normal range of motion. Neck supple. Carotid bruit is not present. No mass and no thyromegaly present.  Cardiovascular: Normal rate, regular rhythm, S1 normal, S2 normal, normal heart sounds and intact distal pulses. Exam reveals no gallop.  No murmur heard.  Pulmonary/Chest: Effort normal and breath sounds normal. No respiratory distress. She has no wheezes. She has no rhonchi. She has no rales.  Abdominal: Soft. Normal appearance and bowel sounds are normal. She exhibits  no distension, no fluid wave, no abdominal bruit and no mass. There is no hepatosplenomegaly. There is no tenderness. There is no rebound, no guarding and no CVA tenderness. No hernia.  Genitourinary: Vagina normal and uterus normal. No breast swelling, tenderness, discharge or bleeding. Pelvic exam was performed with patient prone. There is no rash, tenderness or lesion on the right labia. There is no rash, tenderness or lesion on the left labia. Uterus is not enlarged and not tender. Cervix exhibits no motion tenderness, no discharge and no friability. Right adnexum displays no mass, no tenderness and no fullness. Left adnexum displays no mass, no tenderness and no fullness.  PAP and DVE performed.  Lymphadenopathy:  She has no cervical adenopathy.  She has no axillary adenopathy.  Neurological: She is alert. She has normal strength. No cranial nerve deficit but has numbness on right lateral thigh oval patch   nml monofilamant and foot exam, no callus Skin: Skin is warm, dry and intact. No rash noted.  Psychiatric: Her speech is normal and behavior is normal. Judgment normal. Her mood appears not anxious. Cognition and memory are normal. She does not exhibit a depressed mood.  Assessment & Plan:   The patient's preventative maintenance and recommended screening tests for an annual wellness exam were reviewed in full today.  Brought up to date unless services declined.  Counselled on the importance of diet, exercise, and its role in overall health and mortality.  The patient's FH and SH was reviewed, including their home life, tobacco status, and drug and alcohol status.   Mammogram: due.  PAP/DVE: last pap 2012, neg, on q 3 years. DVE yearly.  PAP due today Colonoscopy: 10/2009, Dr. Markham JordanElliot, no polyps, repeat in 10 years.  Vaccines: up to date with Td  Mom with osteoporosis dx age 59...otherwise low risk.

## 2014-05-11 NOTE — Assessment & Plan Note (Signed)
LDL at goal On 80 mg of lipitor but will hold given increased LFTs.

## 2014-05-11 NOTE — Patient Instructions (Addendum)
Work on low carb  Diabetes diet. Start regular exercise 3-5 times a week. Stop at front desk on way out to set up nutrion referral. Stop at lab on way out.  Stop liptor for 1 week. Then return in 1 week for lab check of liver tests.  Increase lexapro to 20 mg daily. Schedule DM follow up in 3 months with labs prior. Schedule mammogram on own.

## 2014-05-11 NOTE — Progress Notes (Signed)
Pre visit review using our clinic review tool, if applicable. No additional management support is needed unless otherwise documented below in the visit note. 

## 2014-05-11 NOTE — Assessment & Plan Note (Signed)
Likely multifactorial from fatty liver and statin. Will hold statin, if not improving refer to GI for further eval.

## 2014-05-12 ENCOUNTER — Encounter: Payer: Self-pay | Admitting: *Deleted

## 2014-05-12 LAB — CYTOLOGY - PAP

## 2014-05-13 ENCOUNTER — Encounter: Payer: Self-pay | Admitting: Family Medicine

## 2014-05-18 ENCOUNTER — Telehealth: Payer: Self-pay | Admitting: Family Medicine

## 2014-05-18 ENCOUNTER — Other Ambulatory Visit (INDEPENDENT_AMBULATORY_CARE_PROVIDER_SITE_OTHER): Payer: Managed Care, Other (non HMO)

## 2014-05-18 DIAGNOSIS — IMO0001 Reserved for inherently not codable concepts without codable children: Secondary | ICD-10-CM

## 2014-05-18 DIAGNOSIS — K760 Fatty (change of) liver, not elsewhere classified: Secondary | ICD-10-CM

## 2014-05-18 DIAGNOSIS — R7989 Other specified abnormal findings of blood chemistry: Secondary | ICD-10-CM

## 2014-05-18 DIAGNOSIS — E1165 Type 2 diabetes mellitus with hyperglycemia: Secondary | ICD-10-CM

## 2014-05-18 DIAGNOSIS — R945 Abnormal results of liver function studies: Principal | ICD-10-CM

## 2014-05-18 LAB — COMPREHENSIVE METABOLIC PANEL
ALBUMIN: 4.1 g/dL (ref 3.5–5.2)
ALT: 132 U/L — ABNORMAL HIGH (ref 0–35)
AST: 78 U/L — ABNORMAL HIGH (ref 0–37)
Alkaline Phosphatase: 94 U/L (ref 39–117)
BUN: 14 mg/dL (ref 6–23)
CALCIUM: 8.8 mg/dL (ref 8.4–10.5)
CHLORIDE: 102 meq/L (ref 96–112)
CO2: 31 mEq/L (ref 19–32)
Creatinine, Ser: 0.5 mg/dL (ref 0.4–1.2)
GFR: 140.65 mL/min (ref >60.00)
GLUCOSE: 129 mg/dL — AB (ref 70–99)
Potassium: 3.9 mEq/L (ref 3.5–5.1)
Sodium: 140 mEq/L (ref 135–145)
Total Bilirubin: 0.8 mg/dL (ref 0.2–1.2)
Total Protein: 7.2 g/dL (ref 6.0–8.3)

## 2014-05-18 MED ORDER — TRAMADOL HCL 50 MG PO TABS: 50.0000 mg | ORAL_TABLET | Freq: Three times a day (TID) | ORAL | Status: DC | PRN

## 2014-05-18 NOTE — Addendum Note (Signed)
Addended by: Kerby NoraBEDSOLE, Lisandra Mathisen E on: 05/18/2014 03:57 PM   Modules accepted: Level of Service

## 2014-05-18 NOTE — Telephone Encounter (Signed)
[facility]ol called to [facility] [address]

## 2014-05-18 NOTE — Telephone Encounter (Signed)
Ms. Karla Peterson notified prescription for Tramadol has been called to her pharmacy and Karla Peterson will be in touch with her GI appointment.

## 2014-05-18 NOTE — Telephone Encounter (Signed)
Referral sent to GIU. Sent in tramadol for pain.

## 2014-05-18 NOTE — Telephone Encounter (Signed)
Message copied by Excell SeltzerBEDSOLE, Sheneika Walstad E on Tue May 18, 2014  3:57 PM ------      Message from: Damita LackLORING, DONNA S      Created: Tue May 18, 2014  3:06 PM       Ms. Earl LitesGregory notified as instructed by telephone.   She is agreeable to the GI referral.  Dois DavenportSandra states her legs are killing her to the point she had to come home from work today.      Asking if Dr. Ermalene SearingBedsole would call her in something for pain.  Please advise. ------

## 2014-05-20 ENCOUNTER — Encounter: Payer: Self-pay | Admitting: Internal Medicine

## 2014-06-03 LAB — HM DIABETES EYE EXAM

## 2014-06-25 ENCOUNTER — Ambulatory Visit: Payer: Self-pay | Admitting: Family Medicine

## 2014-06-29 ENCOUNTER — Telehealth: Payer: Self-pay | Admitting: Family Medicine

## 2014-06-29 DIAGNOSIS — E1165 Type 2 diabetes mellitus with hyperglycemia: Principal | ICD-10-CM

## 2014-06-29 DIAGNOSIS — IMO0001 Reserved for inherently not codable concepts without codable children: Secondary | ICD-10-CM

## 2014-06-29 MED ORDER — GLUCOSE BLOOD VI STRP: ORAL_STRIP | Status: DC

## 2014-06-29 NOTE — Telephone Encounter (Addendum)
Karla DavenportSandra notified that prescription for test strips have been sent to her pharmacy.

## 2014-06-29 NOTE — Telephone Encounter (Signed)
Pt has started going to a healthy lifestyles group.  She was given a diabetic meter to use each morning and night.  Her group told her to call us to get a rx called in for One Touch Ultra Test strips.  [facility] uses [facility] [address]  [phone]

## 2014-07-21 ENCOUNTER — Other Ambulatory Visit: Payer: Self-pay | Admitting: Family Medicine

## 2014-07-27 ENCOUNTER — Ambulatory Visit: Payer: Self-pay | Admitting: Family Medicine

## 2014-07-28 ENCOUNTER — Other Ambulatory Visit (INDEPENDENT_AMBULATORY_CARE_PROVIDER_SITE_OTHER): Payer: Managed Care, Other (non HMO)

## 2014-07-28 ENCOUNTER — Ambulatory Visit (INDEPENDENT_AMBULATORY_CARE_PROVIDER_SITE_OTHER): Payer: Managed Care, Other (non HMO) | Admitting: Internal Medicine

## 2014-07-28 ENCOUNTER — Encounter: Payer: Self-pay | Admitting: Internal Medicine

## 2014-07-28 VITALS — BP 132/82 | HR 72 | Ht 62.5 in | Wt 173.2 lb

## 2014-07-28 DIAGNOSIS — K760 Fatty (change of) liver, not elsewhere classified: Secondary | ICD-10-CM

## 2014-07-28 DIAGNOSIS — R748 Abnormal levels of other serum enzymes: Secondary | ICD-10-CM

## 2014-07-28 DIAGNOSIS — K7689 Other specified diseases of liver: Secondary | ICD-10-CM

## 2014-07-28 LAB — PROTIME-INR
INR: 1 ratio (ref 0.8–1.0)
PROTHROMBIN TIME: 11.2 s (ref 9.6–13.1)

## 2014-07-28 LAB — HEPATITIS A ANTIBODY, TOTAL: Hep A Total Ab: NONREACTIVE

## 2014-07-28 LAB — CBC WITH DIFFERENTIAL/PLATELET
Basophils Absolute: 0 10*3/uL (ref 0.0–0.1)
Basophils Relative: 0.6 % (ref 0.0–3.0)
EOS ABS: 0.2 10*3/uL (ref 0.0–0.7)
Eosinophils Relative: 2.6 % (ref 0.0–5.0)
HCT: 38.4 % (ref 36.0–46.0)
Hemoglobin: 12.8 g/dL (ref 12.0–15.0)
LYMPHS ABS: 2 10*3/uL (ref 0.7–4.0)
Lymphocytes Relative: 26.7 % (ref 12.0–46.0)
MCHC: 33.5 g/dL (ref 30.0–36.0)
MCV: 88.2 fl (ref 78.0–100.0)
MONO ABS: 0.6 10*3/uL (ref 0.1–1.0)
Monocytes Relative: 7.9 % (ref 3.0–12.0)
NEUTROS ABS: 4.7 10*3/uL (ref 1.4–7.7)
Neutrophils Relative %: 62.2 % (ref 43.0–77.0)
Platelets: 344 10*3/uL (ref 150.0–400.0)
RBC: 4.35 Mil/uL (ref 3.87–5.11)
RDW: 14.1 % (ref 11.5–15.5)
WBC: 7.6 10*3/uL (ref 4.0–10.5)

## 2014-07-28 LAB — COMPREHENSIVE METABOLIC PANEL
ALT: 129 U/L — AB (ref 0–35)
AST: 90 U/L — ABNORMAL HIGH (ref 0–37)
Albumin: 3.9 g/dL (ref 3.5–5.2)
Alkaline Phosphatase: 89 U/L (ref 39–117)
BILIRUBIN TOTAL: 0.7 mg/dL (ref 0.2–1.2)
BUN: 14 mg/dL (ref 6–23)
CHLORIDE: 104 meq/L (ref 96–112)
CO2: 27 meq/L (ref 19–32)
Calcium: 9 mg/dL (ref 8.4–10.5)
Creatinine, Ser: 0.5 mg/dL (ref 0.4–1.2)
GFR: 125.37 mL/min (ref >60.00)
Glucose, Bld: 130 mg/dL — ABNORMAL HIGH (ref 70–99)
Potassium: 3.7 mEq/L (ref 3.5–5.1)
SODIUM: 139 meq/L (ref 135–145)
TOTAL PROTEIN: 7.4 g/dL (ref 6.0–8.3)

## 2014-07-28 LAB — HEPATITIS B CORE ANTIBODY, TOTAL: Hep B Core Total Ab: NONREACTIVE

## 2014-07-28 LAB — IRON: Iron: 96 ug/dL (ref 42–145)

## 2014-07-28 LAB — FERRITIN: FERRITIN: 114.9 ng/mL (ref 10.0–291.0)

## 2014-07-28 LAB — HEPATITIS C ANTIBODY: HCV Ab: NEGATIVE

## 2014-07-28 LAB — HEPATITIS B SURFACE ANTIBODY,QUALITATIVE: Hep B S Ab: NEGATIVE

## 2014-07-28 LAB — HEPATITIS B SURFACE ANTIGEN: Hepatitis B Surface Ag: NEGATIVE

## 2014-07-28 NOTE — Patient Instructions (Addendum)
You have been scheduled for an abdominal ultrasound at [facility] (1st floor of hospital) on 08/05/2014 at 11am. Please arrive 15 minutes prior to your appointment for registration. Make certain not to have anything to eat or drink 6 hours prior to your appointment. Should you need to reschedule your appointment, please contact radiology at [phone]. This test typically takes about 30 minutes to perform.  Go to the basement for labs  Follow up in 3 months Increase Excersize 10% weight loss Use OTC Vitamin E 800IU Daily

## 2014-07-28 NOTE — Progress Notes (Signed)
Patient ID: [name], female   DOB: 1956-02-22, 58 y.o.   MRN: [id number] HPI: Karla Peterson is a 58 year old female with past medical history of elevated liver enzymes, GERD, hyperlipidemia, and probable fatty liver who is seen in consultation at the request of Dr. Ermalene Searing to evaluate elevated liver enzymes. She is here with her husband. She reports she's been told that she has elevated liver enzymes over the last few years. She denies abdominal complaint. No abdominal pain. No history of jaundice, itching, ascites or lower extremity edema. Good appetite, no nausea or vomiting. No trouble swallowing. No significant heartburn with pantoprazole 40 mg daily. Bowel movements are regular without blood in her stool or melena. She had colonoscopy for screening last year with Dr. Mechele Collin in [place]. Reportedly this was normal. Family history notable for alcoholic cirrhosis in her father. She denies alcohol consumption throughout her lifetime. No tattoos or IV drug use. No history of blood transfusion. She had an abdominal ultrasound in 2013 which showed fatty liver. She has gained approximately 23 pounds in the last year but has started exercising and watching her diet with a plan to begin a formal exercise regimen in October because they are joining a gym. She denies over-the-counter supplements though in the past has used vitamin supplements for weight loss. She stopped Lipitor in June to see if this might have affected her liver enzymes. She has been told she has prediabetes.  Past Medical History  Diagnosis Date  . GERD (gastroesophageal reflux disease)   . Hyperlipidemia   . Foot fracture, left 2009    Jones fracture- no surgery  . Fatty liver disease, nonalcoholic 01/26/2012    Past Surgical History  Procedure Laterality Date  . Tonsillectomy    . Back surgery  2001    herniated disk, lumbar spine    Outpatient Prescriptions Prior to Visit  Medication Sig Dispense Refill  . atorvastatin  (LIPITOR) 80 MG tablet take 1 tablet by mouth at bedtime  90 tablet  0  . escitalopram (LEXAPRO) 20 MG tablet Take 1 tablet (20 mg total) by mouth daily.  30 tablet  5  . glucose blood (ONE TOUCH ULTRA TEST) test strip Use to check blood sugar two times a day.  Dx: 250.02  100 each  12  . pantoprazole (PROTONIX) 40 MG tablet take 1 tablet by mouth once daily  90 tablet  1  . SUMAtriptan (IMITREX) 100 MG tablet Take 1 tablet by mouth as needed for migraine, may repeat in 2 hours x 1.  9 tablet  1  . traMADol (ULTRAM) 50 MG tablet Take 1 tablet (50 mg total) by mouth every 8 (eight) hours as needed.  30 tablet  0  . fluticasone (FLONASE) 50 MCG/ACT nasal spray Place 2 sprays into the nose daily.  1 g  0   No facility-administered medications prior to visit.    Not on File  Family History  Problem Relation Age of Onset  . Breast cancer Mother   . Aneurysm Father     aortic  . Heart disease Father     CABG  . Liver disease Father     jaundice, Hep C after transfusion    History  Substance Use Topics  . Smoking status: Never Smoker   . Smokeless tobacco: Never Used  . Alcohol Use: Yes     Comment: occassionally    ROS: As per history of present illness, otherwise negative  BP 132/82  Pulse 72  Ht  5' 2.5" (1.588 m)  Wt 173 lb 4 oz (78.586 kg)  BMI 31.16 kg/m2 Constitutional: Well-developed and well-nourished. No distress. HEENT: Normocephalic and atraumatic. Oropharynx is clear and moist. No oropharyngeal exudate. Conjunctivae are normal.  No scleral icterus. Neck: Neck supple. Trachea midline. Cardiovascular: Normal rate, regular rhythm and intact distal pulses. No M/R/G Pulmonary/chest: Effort normal and breath sounds normal. No wheezing, rales or rhonchi. Abdominal: Soft, obese, nontender, nondistended. Bowel sounds active throughout. There are no masses palpable. No hepatosplenomegaly. Extremities: no clubbing, cyanosis, or edema Lymphadenopathy: No cervical adenopathy  noted. Neurological: Alert and oriented to person place and time. No asterixis Skin: Skin is warm and dry. No rashes noted. No palmar erythema or spider angiomata Psychiatric: Normal mood and affect. Behavior is normal.  RELEVANT LABS AND IMAGING:  CMP     Component Value Date/Time   NA 140 05/18/2014 0806   K 3.9 05/18/2014 0806   CL 102 05/18/2014 0806   CO2 31 05/18/2014 0806   GLUCOSE 129* 05/18/2014 0806   BUN 14 05/18/2014 0806   CREATININE 0.5 05/18/2014 0806   CALCIUM 8.8 05/18/2014 0806   PROT 7.2 05/18/2014 0806   ALBUMIN 4.1 05/18/2014 0806   AST 78* 05/18/2014 0806   ALT 132* 05/18/2014 0806   ALKPHOS 94 05/18/2014 0806   BILITOT 0.8 05/18/2014 0806   GFRNONAA 110.71 04/27/2009 1142   Abdominal ultrasound reviewed from 2013  ASSESSMENT/PLAN: 58 year old female with past medical history of elevated liver enzymes, GERD, hyperlipidemia, and probable fatty liver who is seen in consultation at the request of Dr. Ermalene Searing to evaluate elevated liver enzymes.  1. Elevated serum transaminases/fatty liver -- her risk factors are that for nonalcoholic steatohepatitis, but I would like to rule out other causes of potential liver inflammation. We will perform a battery of tests today including repeat liver enzymes, CBC, INR, IgG, ANA, AMA, ASMA, iron studies, ceruloplasmin, alpha-1 antitrypsin, celiac panel, and viral hepatitis studies. I would like to repeat liver ultrasound with elastography to assess degree of fibrosis.  There are no clinical signs of advanced or chronic liver disease today. We discussed fatty liver disease at length including the importance of daily regular exercise and also weight reduction. I would like her to set a goal for 10-15% weight reduction over the next 12 months. I have recommended oral vitamin E 800 international units daily. If liver enzymes do not improve with Lipitor cessation, she should resume statins to lower cholesterol. Have recommended against additional oral  supplements as they are not always controlled and can lead to liver inflammation. If she is not immune to hepatitis A or B., I will recommend vaccination. I would like to see her back in 3 months.  2. CRC screen -- up-to-date, colonoscopy one year ago reportedly normal.

## 2014-07-29 LAB — IGG, IGA, IGM
IGM, SERUM: 168 mg/dL (ref 52–322)
IgA: 187 mg/dL (ref 69–380)
IgG (Immunoglobin G), Serum: 857 mg/dL (ref 690–1700)

## 2014-07-29 LAB — TISSUE TRANSGLUTAMINASE, IGA: Tissue Transglutaminase Ab, IgA: 4.7 U/mL (ref <20)

## 2014-07-29 LAB — ANTI-SMOOTH MUSCLE ANTIBODY, IGG: SMOOTH MUSCLE AB: 7 U (ref <20)

## 2014-07-30 LAB — CERULOPLASMIN: Ceruloplasmin: 23 mg/dL (ref 18–53)

## 2014-07-30 LAB — ALPHA-1-ANTITRYPSIN: A1 ANTITRYPSIN SER: 140 mg/dL (ref 83–199)

## 2014-07-30 LAB — MITOCHONDRIAL ANTIBODIES: MITOCHONDRIAL M2 AB, IGG: 0.2 (ref <0.91)

## 2014-08-03 ENCOUNTER — Telehealth: Payer: Self-pay | Admitting: *Deleted

## 2014-08-03 NOTE — Telephone Encounter (Signed)
Message copied by Marlowe Kays on Tue Aug 03, 2014 12:15 PM ------      Message from: Beverley Fiedler      Created: Tue Aug 03, 2014 11:41 AM       Zella Ball      Was this test done? ANA?      JMP      ----- Message -----         From: SYSTEM         Sent: 08/02/2014  12:01 AM           To: Beverley Fiedler, MD                   ------

## 2014-08-03 NOTE — Telephone Encounter (Signed)
Dr Rhea Belton  I called [facility] they stated our lab did not submit serum for the ANA tube...   Anyway they are using the serum they still have to run the test

## 2014-08-03 NOTE — Telephone Encounter (Signed)
Order was placed for the test   Look under labs it shows that I put the order in. I will have to contact the lab

## 2014-08-04 LAB — ANA: ANA: NEGATIVE

## 2014-08-05 ENCOUNTER — Ambulatory Visit (HOSPITAL_COMMUNITY): Payer: Managed Care, Other (non HMO)

## 2014-08-05 ENCOUNTER — Other Ambulatory Visit: Payer: Self-pay

## 2014-08-05 DIAGNOSIS — R945 Abnormal results of liver function studies: Principal | ICD-10-CM

## 2014-08-05 DIAGNOSIS — R7989 Other specified abnormal findings of blood chemistry: Secondary | ICD-10-CM

## 2014-08-16 ENCOUNTER — Ambulatory Visit (HOSPITAL_COMMUNITY)
Admission: RE | Admit: 2014-08-16 | Discharge: 2014-08-16 | Disposition: A | Discharge status: Home / Self Care | Payer: Managed Care, Other (non HMO) | Source: Ambulatory Visit | Attending: Internal Medicine | Admitting: Internal Medicine

## 2014-08-16 DIAGNOSIS — K7689 Other specified diseases of liver: Secondary | ICD-10-CM | POA: Insufficient documentation

## 2014-08-16 DIAGNOSIS — R748 Abnormal levels of other serum enzymes: Secondary | ICD-10-CM | POA: Diagnosis present

## 2014-08-26 ENCOUNTER — Ambulatory Visit: Payer: Self-pay | Admitting: Family Medicine

## 2014-08-27 ENCOUNTER — Other Ambulatory Visit: Payer: Managed Care, Other (non HMO)

## 2014-08-27 ENCOUNTER — Telehealth: Payer: Self-pay | Admitting: Family Medicine

## 2014-08-27 DIAGNOSIS — E785 Hyperlipidemia, unspecified: Secondary | ICD-10-CM

## 2014-08-27 DIAGNOSIS — E559 Vitamin D deficiency, unspecified: Secondary | ICD-10-CM

## 2014-08-27 DIAGNOSIS — R945 Abnormal results of liver function studies: Secondary | ICD-10-CM

## 2014-08-27 DIAGNOSIS — R7989 Other specified abnormal findings of blood chemistry: Secondary | ICD-10-CM

## 2014-08-27 DIAGNOSIS — E119 Type 2 diabetes mellitus without complications: Secondary | ICD-10-CM

## 2014-08-27 NOTE — Telephone Encounter (Signed)
Message copied by Excell SeltzerBEDSOLE, Daquan Crapps E on Fri Aug 27, 2014  8:24 AM ------      Message from: Alvina ChouWALSH, TERRI J      Created: Thu Aug 19, 2014  2:53 PM      Regarding: Lab orders for FRIDAY, 10.2.15       Lab orders for 3 month f/u ------

## 2014-09-01 ENCOUNTER — Other Ambulatory Visit (INDEPENDENT_AMBULATORY_CARE_PROVIDER_SITE_OTHER): Payer: Managed Care, Other (non HMO)

## 2014-09-01 DIAGNOSIS — E119 Type 2 diabetes mellitus without complications: Secondary | ICD-10-CM

## 2014-09-01 LAB — HEMOGLOBIN A1C: Hgb A1c MFr Bld: 6.7 % — ABNORMAL HIGH (ref 4.6–6.5)

## 2014-09-03 ENCOUNTER — Ambulatory Visit (INDEPENDENT_AMBULATORY_CARE_PROVIDER_SITE_OTHER): Payer: Managed Care, Other (non HMO) | Admitting: Family Medicine

## 2014-09-03 ENCOUNTER — Encounter: Payer: Self-pay | Admitting: Family Medicine

## 2014-09-03 VITALS — BP 169/93 | HR 73 | Temp 98.6°F | Ht 62.5 in | Wt 174.8 lb

## 2014-09-03 DIAGNOSIS — R200 Anesthesia of skin: Secondary | ICD-10-CM

## 2014-09-03 DIAGNOSIS — R202 Paresthesia of skin: Secondary | ICD-10-CM

## 2014-09-03 DIAGNOSIS — H6981 Other specified disorders of Eustachian tube, right ear: Secondary | ICD-10-CM | POA: Insufficient documentation

## 2014-09-03 DIAGNOSIS — Z23 Encounter for immunization: Secondary | ICD-10-CM

## 2014-09-03 DIAGNOSIS — R7989 Other specified abnormal findings of blood chemistry: Secondary | ICD-10-CM

## 2014-09-03 DIAGNOSIS — R945 Abnormal results of liver function studies: Secondary | ICD-10-CM

## 2014-09-03 DIAGNOSIS — K760 Fatty (change of) liver, not elsewhere classified: Secondary | ICD-10-CM

## 2014-09-03 DIAGNOSIS — E119 Type 2 diabetes mellitus without complications: Secondary | ICD-10-CM

## 2014-09-03 DIAGNOSIS — E785 Hyperlipidemia, unspecified: Secondary | ICD-10-CM

## 2014-09-03 DIAGNOSIS — H6991 Unspecified Eustachian tube disorder, right ear: Secondary | ICD-10-CM | POA: Insufficient documentation

## 2014-09-03 LAB — HM DIABETES FOOT EXAM

## 2014-09-03 MED ORDER — ATORVASTATIN CALCIUM 80 MG PO TABS: ORAL_TABLET | ORAL | Status: DC

## 2014-09-03 MED ORDER — TRAMADOL HCL 50 MG PO TABS
50.0000 mg | ORAL_TABLET | Freq: Three times a day (TID) | ORAL | Status: DC | PRN
Start: 2014-09-03 — End: 2017-01-03

## 2014-09-03 NOTE — Addendum Note (Signed)
Addended by: Damita LackLORING, DONNA S on: 09/03/2014 09:41 AM   Modules accepted: Orders

## 2014-09-03 NOTE — Assessment & Plan Note (Signed)
Restart atorvastatin.  

## 2014-09-03 NOTE — Assessment & Plan Note (Signed)
Will given 1st of series of hep a and hep B today.

## 2014-09-03 NOTE — Progress Notes (Signed)
Subjective:    Patient ID: Karla Peterson, female    DOB: 08-15-1956, 58 y.o.   MRN: [id number]  Diabetes Pertinent negatives for diabetes include no chest pain and no fatigue.    58 year old female with presents for 3 month follow up DM.   She has also been referred to GI for eval of elevated LFTs.   She is currently off atorvastatin given increase LFTs.She never resumed taking it. Dr. Rhea BeltonPyrtle recommended on 07/2014: She can likely resume atorvastatin but ask her to touch base with her primary care to this regard, okay from my perspective Other liver labs normal or negative for other causes of elevated liver enzymes Non-alcoholic fatty liver disease is felt to explain her persistent enzyme elevation I recommend diet and exercise as we discussed along with vitamin E 800 international units daily She is not immune to hepatitis A or B. and I recommend vaccination for both to help protect her liver against future injury No evidence for autoimmune liver disease Father with cirrhosis from ETOH and possible hepatitis.   She would like to get hep A and B here.   Wt Readings from Last 3 Encounters:  09/03/14 174 lb 12 oz (79.266 kg)  07/28/14 173 lb 4 oz (78.586 kg)  05/11/14 172 lb 8 oz (78.245 kg)   DM, recent new diagnosis:improved control on no med with diet control. Lab Results  Component Value Date   HGBA1C 6.7* 09/01/2014  [facility]  Checking FBS daily: 97-101, never > 200, < 60 No foot ulcers. Last eye MD: 05/2014  BP Readings from Last 3 Encounters:  09/03/14 169/93  07/28/14 132/82  05/11/14 130/80       Review of Systems  Constitutional: Negative for fever and fatigue.  HENT: Negative for ear pain.   Eyes: Negative for pain.  Respiratory: Negative for chest tightness and shortness of breath.   Cardiovascular: Negative for chest pain, palpitations and leg swelling.  Gastrointestinal: Negative for abdominal pain.    Genitourinary: Negative for dysuria.       Objective:   Physical Exam  Constitutional: Vital signs are normal. She appears well-developed and well-nourished. She is cooperative.  Non-toxic appearance. She does not appear ill. No distress.  HENT:  Head: Normocephalic.  Right Ear: Hearing, external ear and ear canal normal. Tympanic membrane is not erythematous, not retracted and not bulging. A middle ear effusion is present.  Left Ear: Hearing, tympanic membrane, external ear and ear canal normal. Tympanic membrane is not erythematous, not retracted and not bulging.  Nose: No mucosal edema or rhinorrhea. Right sinus exhibits no maxillary sinus tenderness and no frontal sinus tenderness. Left sinus exhibits no maxillary sinus tenderness and no frontal sinus tenderness.  Mouth/Throat: Uvula is midline, oropharynx is clear and moist and mucous membranes are normal.  Eyes: Conjunctivae, EOM and lids are normal. Pupils are equal, round, and reactive to light. Lids are everted and swept, no foreign bodies found.  Neck: Trachea normal and normal range of motion. Neck supple. Carotid bruit is not present. No mass and no thyromegaly present.  Cardiovascular: Normal rate, regular rhythm, S1 normal, S2 normal, normal heart sounds, intact distal pulses and normal pulses.  Exam reveals no gallop and no friction rub.   No murmur heard. Pulmonary/Chest: Effort normal and breath sounds normal. Not tachypneic. No respiratory distress. She has no decreased breath sounds. She has no wheezes. She has no rhonchi. She has no rales.  Abdominal:  Soft. Normal appearance and bowel sounds are normal. There is no tenderness.  Neurological: She is alert.  Skin: Skin is warm, dry and intact. No rash noted.  Psychiatric: Her speech is normal and behavior is normal. Judgment and thought content normal. Her mood appears not anxious. Cognition and memory are normal. She does not exhibit a depressed mood.   Diabetic foot  exam: Normal inspection No skin breakdown No calluses  Normal DP pulses Normal sensation to light touch and monofilament Nails normal        Assessment & Plan:

## 2014-09-03 NOTE — Patient Instructions (Addendum)
Check BP daily, goal < 140/90. Call or MyChart in 1-2 weeks with measurement. Start flonase 2 sprays per nostril daily for fluid in left ear.  Call if not improving as expected. Restart atorvastatin.  Return in  1 months for  hep B only, then again in 6 months for hep A and B.

## 2014-09-03 NOTE — Assessment & Plan Note (Addendum)
Improving with lifestyle changes. 

## 2014-09-03 NOTE — Progress Notes (Signed)
Pre visit review using our clinic review tool, if applicable. No additional management support is needed unless otherwise documented below in the visit note. 

## 2014-09-03 NOTE — Assessment & Plan Note (Signed)
Treat with nasal steroid. °

## 2014-09-22 ENCOUNTER — Encounter: Payer: Self-pay | Admitting: Internal Medicine

## 2014-09-26 ENCOUNTER — Ambulatory Visit: Payer: Self-pay | Admitting: Family Medicine

## 2014-10-06 ENCOUNTER — Ambulatory Visit: Payer: Managed Care, Other (non HMO)

## 2014-11-11 ENCOUNTER — Ambulatory Visit (INDEPENDENT_AMBULATORY_CARE_PROVIDER_SITE_OTHER): Payer: Managed Care, Other (non HMO)

## 2014-11-11 DIAGNOSIS — Z23 Encounter for immunization: Secondary | ICD-10-CM

## 2015-02-04 ENCOUNTER — Other Ambulatory Visit: Payer: Self-pay | Admitting: Family Medicine

## 2015-02-28 ENCOUNTER — Ambulatory Visit (INDEPENDENT_AMBULATORY_CARE_PROVIDER_SITE_OTHER): Payer: Managed Care, Other (non HMO) | Admitting: Podiatry

## 2015-02-28 ENCOUNTER — Encounter: Payer: Self-pay | Admitting: Podiatry

## 2015-02-28 ENCOUNTER — Ambulatory Visit (INDEPENDENT_AMBULATORY_CARE_PROVIDER_SITE_OTHER): Payer: Managed Care, Other (non HMO)

## 2015-02-28 VITALS — BP 134/72 | HR 68 | Resp 16

## 2015-02-28 DIAGNOSIS — M779 Enthesopathy, unspecified: Secondary | ICD-10-CM | POA: Diagnosis not present

## 2015-02-28 DIAGNOSIS — G5781 Other specified mononeuropathies of right lower limb: Secondary | ICD-10-CM

## 2015-02-28 DIAGNOSIS — G5761 Lesion of plantar nerve, right lower limb: Secondary | ICD-10-CM

## 2015-02-28 NOTE — Progress Notes (Signed)
   Subjective:    Patient ID: Karla Peterson, female    DOB: July 10, 1956, 59 y.o.   MRN: [id number]  HPI Comments: "I have pain underneath this foot"  Patient c/o sharp, stinging plantar forefoot and 4th toe right for 1-2 months. She was up on a ladder and come down and noticed pain. No injury. The toe has been red and swollen. Tried soaking and drinking cherry juice in case of gout. She takes her pain meds prn. Pain is intermittent.     Review of Systems  Constitutional: Positive for unexpected weight change.  HENT: Positive for sinus pressure.   Eyes: Positive for itching.  Musculoskeletal: Positive for myalgias.  Neurological: Positive for weakness, numbness and headaches.  All other systems reviewed and are negative.      Objective:   Physical Exam: I have reviewed her past medical history medications allergy surgery social history and review of systems. Pulses are strongly palpable bilateral. Neurologic sensorium is intact per Semmes-Weinstein monofilament. Palpable Mulder's click to the third interdigital space of the right foot. With pain on palpation of the third interdigital nerve right. Deep tendon reflexes are intact bilateral and muscle strength is normal bilateral. Orthopedic evaluation to straight small flexible hammertoe deformities. Radiographic evaluation does not Mr. any type of osseus abnormalities in the area of the mild hammertoe deformities.        Assessment & Plan:  Assessment: Neuroma third interdigital space right foot.  Plan: Injected the neuroma today with Kenalog and local anesthetic. Discussed appropriate shoe gear retching exercises ice therapy shoe gear modifications. Follow up with her in 1 month

## 2015-03-08 ENCOUNTER — Ambulatory Visit (INDEPENDENT_AMBULATORY_CARE_PROVIDER_SITE_OTHER): Payer: Managed Care, Other (non HMO)

## 2015-03-08 DIAGNOSIS — Z23 Encounter for immunization: Secondary | ICD-10-CM | POA: Diagnosis not present

## 2015-04-07 ENCOUNTER — Encounter: Payer: Self-pay | Admitting: Internal Medicine

## 2015-04-14 ENCOUNTER — Other Ambulatory Visit: Payer: Self-pay | Admitting: Family Medicine

## 2015-06-17 LAB — HM DIABETES EYE EXAM

## 2015-07-21 ENCOUNTER — Telehealth: Payer: Self-pay | Admitting: Family Medicine

## 2015-07-21 NOTE — Telephone Encounter (Signed)
Please call and schedule CPE with fasting labs for Dr. Bedsole. 

## 2015-07-21 NOTE — Telephone Encounter (Signed)
cpe 11/4 Lab 10/31  Pt aware Please close

## 2015-09-07 ENCOUNTER — Telehealth: Payer: Self-pay

## 2015-09-07 DIAGNOSIS — Z1231 Encounter for screening mammogram for malignant neoplasm of breast: Secondary | ICD-10-CM

## 2015-09-07 NOTE — Telephone Encounter (Signed)
Pt left v/m; since pt had last mammogram pt had a cyst in breast and pt [facility]; pt was advised needs order for mammogram from PCP. Pt has CPX scheduled on 09/30/15. Pt request cb when order placed for mammogram.

## 2015-09-08 NOTE — Telephone Encounter (Signed)
Left message for Karla DavenportSandra that Dr. Ermalene SearingBedsole has put in the order for her mammogram.

## 2015-09-10 ENCOUNTER — Other Ambulatory Visit: Payer: Self-pay | Admitting: Family Medicine

## 2015-09-14 ENCOUNTER — Telehealth: Payer: Self-pay

## 2015-09-14 ENCOUNTER — Other Ambulatory Visit: Payer: Self-pay | Admitting: *Deleted

## 2015-09-14 ENCOUNTER — Inpatient Hospital Stay
Admission: RE | Admit: 2015-09-14 | Discharge: 2015-09-14 | Disposition: A | Discharge status: Home / Self Care | Payer: Self-pay | Source: Ambulatory Visit | Attending: *Deleted | Admitting: *Deleted

## 2015-09-14 DIAGNOSIS — Z9289 Personal history of other medical treatment: Secondary | ICD-10-CM

## 2015-09-14 DIAGNOSIS — R928 Other abnormal and inconclusive findings on diagnostic imaging of breast: Secondary | ICD-10-CM

## 2015-09-14 NOTE — Telephone Encounter (Signed)
Patient needs order for diagnostic mammogram sent to [facility]. Thanks!

## 2015-09-20 NOTE — Telephone Encounter (Signed)
Routing to Zella BallRobin who is taking care of this!

## 2015-09-20 NOTE — Telephone Encounter (Signed)
Please tell me what to order? UNI right and left US does not make sense.

## 2015-09-20 NOTE — Telephone Encounter (Signed)
Sp[facility].  They need orders for U/S UNI Right and U/S UNI Left Breast Limited.  Order sent as requested.

## 2015-09-20 NOTE — Addendum Note (Signed)
Addended by: Damita LackLORING, Manasseh Pittsley S on: 09/20/2015 01:54 PM   Modules accepted: Orders

## 2015-09-20 NOTE — Telephone Encounter (Signed)
[facility] needs uni right and left ultra sound order before they will make appointment

## 2015-09-21 NOTE — Telephone Encounter (Signed)
Pt wanted to make her own appointment Pt aware orders are in system

## 2015-09-26 ENCOUNTER — Other Ambulatory Visit: Payer: Managed Care, Other (non HMO)

## 2015-09-30 ENCOUNTER — Encounter: Payer: Managed Care, Other (non HMO) | Admitting: Family Medicine

## 2015-10-17 ENCOUNTER — Other Ambulatory Visit: Payer: Self-pay | Admitting: Family Medicine

## 2015-10-17 ENCOUNTER — Ambulatory Visit
Admission: RE | Admit: 2015-10-17 | Discharge: 2015-10-17 | Disposition: A | Discharge status: Home / Self Care | Payer: Managed Care, Other (non HMO) | Source: Ambulatory Visit | Attending: Family Medicine | Admitting: Family Medicine

## 2015-10-17 DIAGNOSIS — R928 Other abnormal and inconclusive findings on diagnostic imaging of breast: Secondary | ICD-10-CM

## 2015-10-17 DIAGNOSIS — N6489 Other specified disorders of breast: Secondary | ICD-10-CM | POA: Diagnosis not present

## 2015-10-17 DIAGNOSIS — R921 Mammographic calcification found on diagnostic imaging of breast: Secondary | ICD-10-CM | POA: Diagnosis not present

## 2015-10-22 ENCOUNTER — Other Ambulatory Visit: Payer: Self-pay | Admitting: Family Medicine

## 2015-10-27 ENCOUNTER — Other Ambulatory Visit: Payer: Managed Care, Other (non HMO)

## 2015-10-27 ENCOUNTER — Telehealth: Payer: Self-pay | Admitting: Family Medicine

## 2015-10-27 ENCOUNTER — Other Ambulatory Visit (INDEPENDENT_AMBULATORY_CARE_PROVIDER_SITE_OTHER): Payer: Managed Care, Other (non HMO)

## 2015-10-27 DIAGNOSIS — E119 Type 2 diabetes mellitus without complications: Secondary | ICD-10-CM

## 2015-10-27 DIAGNOSIS — E785 Hyperlipidemia, unspecified: Secondary | ICD-10-CM | POA: Diagnosis not present

## 2015-10-27 DIAGNOSIS — E559 Vitamin D deficiency, unspecified: Secondary | ICD-10-CM | POA: Diagnosis not present

## 2015-10-27 LAB — COMPREHENSIVE METABOLIC PANEL
ALK PHOS: 116 U/L (ref 39–117)
ALT: 157 U/L — AB (ref 0–35)
AST: 126 U/L — ABNORMAL HIGH (ref 0–37)
Albumin: 4.4 g/dL (ref 3.5–5.2)
BILIRUBIN TOTAL: 0.6 mg/dL (ref 0.2–1.2)
BUN: 14 mg/dL (ref 6–23)
CALCIUM: 9.5 mg/dL (ref 8.4–10.5)
CO2: 28 mEq/L (ref 19–32)
Chloride: 100 mEq/L (ref 96–112)
Creatinine, Ser: 0.62 mg/dL (ref 0.40–1.20)
GFR: 104.17 mL/min (ref >60.00)
Glucose, Bld: 164 mg/dL — ABNORMAL HIGH (ref 70–99)
Potassium: 4.1 mEq/L (ref 3.5–5.1)
Sodium: 138 mEq/L (ref 135–145)
TOTAL PROTEIN: 7.9 g/dL (ref 6.0–8.3)

## 2015-10-27 LAB — VITAMIN D 25 HYDROXY (VIT D DEFICIENCY, FRACTURES): VITD: 28.71 ng/mL — ABNORMAL LOW (ref 30.00–100.00)

## 2015-10-27 LAB — LIPID PANEL
Cholesterol: 235 mg/dL — ABNORMAL HIGH (ref 0–200)
HDL: 40.2 mg/dL (ref >39.00)
LDL Cholesterol: 159 mg/dL — ABNORMAL HIGH (ref 0–99)
NonHDL: 194.73
TRIGLYCERIDES: 178 mg/dL — AB (ref 0.0–149.0)
Total CHOL/HDL Ratio: 6
VLDL: 35.6 mg/dL (ref 0.0–40.0)

## 2015-10-27 LAB — HEMOGLOBIN A1C: Hgb A1c MFr Bld: 7.6 % — ABNORMAL HIGH (ref 4.6–6.5)

## 2015-10-27 LAB — MICROALBUMIN / CREATININE URINE RATIO
CREATININE, U: 210.9 mg/dL
MICROALB UR: 3.9 mg/dL — AB (ref 0.0–1.9)
Microalb Creat Ratio: 1.8 mg/g (ref 0.0–30.0)

## 2015-10-27 NOTE — Telephone Encounter (Signed)
-----   Message from Alvina Chouerri J Walsh sent at 10/25/2015  2:36 PM EST ----- Regarding: Lab orders for Thursday, 12.1.16 Patient is scheduled for CPX labs, please order future labs, Thanks , Camelia Engerri

## 2015-11-01 ENCOUNTER — Ambulatory Visit (INDEPENDENT_AMBULATORY_CARE_PROVIDER_SITE_OTHER): Payer: Managed Care, Other (non HMO) | Admitting: Family Medicine

## 2015-11-01 ENCOUNTER — Encounter: Payer: Self-pay | Admitting: Family Medicine

## 2015-11-01 VITALS — BP 128/76 | HR 95 | Temp 98.5°F | Ht 62.5 in | Wt 177.8 lb

## 2015-11-01 DIAGNOSIS — E785 Hyperlipidemia, unspecified: Secondary | ICD-10-CM | POA: Diagnosis not present

## 2015-11-01 DIAGNOSIS — R809 Proteinuria, unspecified: Secondary | ICD-10-CM | POA: Insufficient documentation

## 2015-11-01 DIAGNOSIS — Z0000 Encounter for general adult medical examination without abnormal findings: Secondary | ICD-10-CM | POA: Diagnosis not present

## 2015-11-01 DIAGNOSIS — R5382 Chronic fatigue, unspecified: Secondary | ICD-10-CM | POA: Insufficient documentation

## 2015-11-01 DIAGNOSIS — Z23 Encounter for immunization: Secondary | ICD-10-CM

## 2015-11-01 DIAGNOSIS — E1165 Type 2 diabetes mellitus with hyperglycemia: Secondary | ICD-10-CM

## 2015-11-01 DIAGNOSIS — E559 Vitamin D deficiency, unspecified: Secondary | ICD-10-CM

## 2015-11-01 DIAGNOSIS — E669 Obesity, unspecified: Secondary | ICD-10-CM | POA: Insufficient documentation

## 2015-11-01 DIAGNOSIS — K760 Fatty (change of) liver, not elsewhere classified: Secondary | ICD-10-CM

## 2015-11-01 LAB — HM DIABETES FOOT EXAM

## 2015-11-01 MED ORDER — METFORMIN HCL ER 500 MG PO TB24: 500.0000 mg | ORAL_TABLET | Freq: Every day | ORAL | Status: DC

## 2015-11-01 NOTE — Progress Notes (Signed)
Pre visit review using our clinic review tool, if applicable. No additional management support is needed unless otherwise documented below in the visit note. 

## 2015-11-01 NOTE — Addendum Note (Signed)
Addended byKerby Nora: BEDSOLE, AMY E on: 11/01/2015 04:56 PM   Modules accepted: SmartSet

## 2015-11-01 NOTE — Patient Instructions (Addendum)
Stop at lab on way out.  If labs are normal.. We may need to proceed with sleep study.  Work on weight loss, regular exercise and healthy eating ( low carb , low fat low chol)  Continue atorvastatin.  Start low dose metfromin 500 mg daily.   Start vit D 3 cap daily.

## 2015-11-01 NOTE — Assessment & Plan Note (Signed)
Slight worsening with poor control of diet.

## 2015-11-01 NOTE — Assessment & Plan Note (Signed)
Work aggressively on lifestyle changes. Start metformin low dose, follow liver panel closely. Follow up in 3 months.

## 2015-11-01 NOTE — Assessment & Plan Note (Signed)
Restart OTc suppelement.

## 2015-11-01 NOTE — Assessment & Plan Note (Signed)
Start with improved DM control, BP already at goal. If not resolving may need ACEI/ ARB.

## 2015-11-01 NOTE — Assessment & Plan Note (Addendum)
Likely multifactorial due to low vit D, poorly controlled DM, deconditioning, obesity.  Recommend better DM control, replete vit D and eval with cbc and TSH to rule these issues out.  If labs negative and not feeling better.. Recommend sleep testing to eval for sleep apnea in setting of snoring.

## 2015-11-01 NOTE — Progress Notes (Addendum)
The patient is here for annual wellness exam and preventative care.   BP Readings from Last 3 Encounters:  11/01/15 128/76  02/28/15 134/72  09/03/14 169/93     She reports fatigue.  She snores. Husband has IT trainerCPA.. ? If she has apnea.  S/P uvulectomy in past for  ? sleep apnea No issue with insomnia. No morning headaches.  No blood loss, no history of thyroid issues.  Depression,well controlled on lexapro.  Denies SI, HI.  She feels she still needs this.. Was unable to come off .   Elevated Cholesterol: On lipitor 80 mg daily  LDL  NOT at goal < 100.  Lab Results  Component Value Date   CHOL 235* 10/27/2015   HDL 40.20 10/27/2015   LDLCALC 159* 10/27/2015   LDLDIRECT 154.6 10/03/2011   TRIG 178.0* 10/27/2015   CHOLHDL 6 10/27/2015  Using medications without problems: ? Elevated LFTS.  Muscle aches: None  Diet compliance:poor  Exercise:Walking, trying to bicyclonee  Other complaints:   Wt Readings from Last 3 Encounters:  11/01/15 177 lb 12 oz (80.627 kg)  09/03/14 174 lb 12 oz (79.266 kg)  07/28/14 173 lb 4 oz (78.586 kg)   Diabetes:   Poor control on no medicaiton. Lab Results  Component Value Date   HGBA1C 7.6* 10/27/2015  Using medications without difficulties: Hypoglycemic episodes: Hyperglycemic episodes: Feet problems: Not  Blood Sugars averaging: eye exam within last year:05/2015 She has seen nutritionist.  Vit D def: low, replete   Elevated liver transaminases: Non-alcoholic fatty liver disease is felt to explain her persistent enzyme elevation.. Per Dr. Rhea BeltonPyrtle.  Recommended restarting atorvastatin 07/2014 Fatty liver On US in past. Nml hepatitis panel in 2012 Hep B and Hep A vaccines given. Father with cirrhosis from ETOH and possible hepatitis.   Review of Systems  Constitutional: Negative for fever, fatigue and unexpected weight change.  HENT: negative for congestion, rhinorrhea, sneezing and postnasal drip. Negative for ear pain, sore  throat, trouble swallowing, neck pain and sinus pressure.  Eyes: Negative for pain and itching.  Respiratory: Negative for cough, shortness of breath and wheezing.  Cardiovascular: Negative for chest pain, palpitations and leg swelling.  Gastrointestinal: Negative for nausea, abdominal pain, diarrhea, constipation and blood in stool.  Genitourinary: Negative for dysuria, hematuria, vaginal discharge, difficulty urinating and menstrual problem.  Some urinary incontinence with cough.  Skin: Negative for rash.  Neurological: Negative for syncope, weakness, light-headedness, numbness and headaches.  Psychiatric/Behavioral: Negative for confusion and dysphoric mood. The patient is not nervous/anxious.  Objective:   Physical Exam  Constitutional: Vital signs are normal. She appears well-developed and well-nourished. She is cooperative. Non-toxic appearance. She does not appear ill. No distress.  HENT:  Head: Normocephalic.  Right Ear: Hearing, tympanic membrane, external ear and ear canal normal. No foreign bodies. Tympanic membrane is not erythematous and not bulging. No decreased hearing is noted.  Left Ear: Hearing, tympanic membrane, external ear and ear canal normal. No foreign bodies. Tympanic membrane is not erythematous and not bulging. No decreased hearing is noted.  Nose: Mucosal edema and rhinorrhea present. Right sinus exhibits no maxillary sinus tenderness and no frontal sinus tenderness. Left sinus exhibits no maxillary sinus tenderness and no frontal sinus tenderness.   Eyes: Conjunctivae, EOM and lids are normal. Pupils are equal, round, and reactive to light. No foreign bodies found.  Neck: Trachea normal and normal range of motion. Neck supple. Carotid bruit is not present. No mass and no thyromegaly present.  Cardiovascular: Normal rate,  regular rhythm, S1 normal, S2 normal, normal heart sounds and intact distal pulses. Exam reveals no gallop.  No murmur heard.   Pulmonary/Chest: Effort normal and breath sounds normal. No respiratory distress. She has no wheezes. She has no rhonchi. She has no rales.  Abdominal: Soft. Normal appearance and bowel sounds are normal. She exhibits no distension, no fluid wave, no abdominal bruit and no mass. There is no hepatosplenomegaly. There is no tenderness. There is no rebound, no guarding and no CVA tenderness. No hernia.  Genitourinary: Vagina normal and uterus normal. No breast swelling, tenderness, discharge or bleeding. Pelvic exam was performed with patient prone. There is no rash, tenderness or lesion on the right labia. There is no rash, tenderness or lesion on the left labia. Uterus is not enlarged and not tender. Cervix exhibits no motion tenderness, no discharge and no friability. Right adnexum displays no mass, no tenderness and no fullness. Left adnexum displays no mass, no tenderness and no fullness.  PAP and DVE performed.  Lymphadenopathy:  She has no cervical adenopathy.  She has no axillary adenopathy.  Neurological: She is alert. She has normal strength. No cranial nerve deficit but has numbness on right lateral thigh oval patch  nml monofilamant and foot exam, no callus Skin: Skin is warm, dry and intact. No rash noted.  Psychiatric: Her speech is normal and behavior is normal. Judgment normal. Her mood appears not anxious. Cognition and memory are normal. She does not exhibit a depressed mood.  Assessment & Plan:   The patient's preventative maintenance and recommended screening tests for an annual wellness exam were reviewed in full today.  Brought up to date unless services declined.  Counselled on the importance of diet, exercise, and its role in overall health and mortality.  The patient's FH and SH was reviewed, including their home life, tobacco status, and drug and alcohol status.   Mammogram: 09/2015 abn, due for repeat in 6 months. PAP/DVE: last pap 2015,  q 3 years. DVE  yearly Colonoscopy: 10/2009, Dr. Markham Jordan, no polyps, repeat in 10 years.  Vaccines: up to date with Td, flu  Mom with osteoporosis dx age 51...otherwise low risk.   Hep C: done nonsmoker  HIV: refused       Body mass index is 31.97 kg/(m^2).

## 2015-11-01 NOTE — Assessment & Plan Note (Signed)
Poor control with poor diet and exercise compliance. Encouraged exercise, weight loss, healthy eating habits. Continue lipitor 80 mg .. Recheck in 3 months, if not at goal change medication or add welchol.

## 2015-11-02 ENCOUNTER — Other Ambulatory Visit: Payer: Managed Care, Other (non HMO)

## 2015-11-02 NOTE — Addendum Note (Signed)
Addended by: Alvina ChouWALSH, TERRI J on: 11/02/2015 05:09 PM   Modules accepted: Orders

## 2015-11-03 ENCOUNTER — Other Ambulatory Visit: Payer: Managed Care, Other (non HMO)

## 2015-11-30 ENCOUNTER — Encounter: Payer: Self-pay | Admitting: Family Medicine

## 2015-11-30 ENCOUNTER — Other Ambulatory Visit: Payer: Self-pay | Admitting: Family Medicine

## 2015-11-30 ENCOUNTER — Ambulatory Visit (INDEPENDENT_AMBULATORY_CARE_PROVIDER_SITE_OTHER): Payer: 59 | Admitting: Family Medicine

## 2015-11-30 VITALS — BP 136/98 | HR 84 | Temp 98.6°F | Wt 174.5 lb

## 2015-11-30 DIAGNOSIS — J019 Acute sinusitis, unspecified: Secondary | ICD-10-CM | POA: Insufficient documentation

## 2015-11-30 MED ORDER — GUAIFENESIN-CODEINE 100-10 MG/5ML PO SYRP: 5.0000 mL | ORAL_SOLUTION | Freq: Three times a day (TID) | ORAL | Status: DC | PRN

## 2015-11-30 MED ORDER — AZITHROMYCIN 250 MG PO TABS: ORAL_TABLET | ORAL | Status: DC

## 2015-11-30 NOTE — Patient Instructions (Signed)
I do think you have sinusitis and laryngitis - treat with zpack and codeine cough syrup for night time. Push fluids and rest. Ok to continue mucinex with plenty of water to help mobilize mucous. Take ibuprofen 400mg  twice daily with food. Watch for fever >101, worsening productive cough, or worsening after initial improvement.

## 2015-11-30 NOTE — Progress Notes (Signed)
BP 136/98 mmHg  Pulse 84  Temp(Src) 98.6 F (37 C) (Oral)  Wt 174 lb 8 oz (79.153 kg)  SpO2 96%   CC: laryngitis  Subjective:    Patient ID: Karla Peterson, female    DOB: [birth year], 60 y.o.   MRN: [id number]  HPI: Karla Peterson is a 60 y.o. female presenting on 11/30/2015 for Laryngitis   9d h/o congestion with hoarse voice with productive coughing, phlegm, headache. Initial improvement, then sudden worsening yesterday with more cough, unable to sleep. Initially with fever that has resolved. + HA, ST, PNdrainage. Mild wheeze. Chest >head congestion.  No chills, ear or tooth pain, dyspnea.   Self treated with mucinex super strength. H/o NAFLD.  No h/o asthma.  No smokers at home. No sick contacts at home.  Relevant past medical, surgical, family and social history reviewed and updated as indicated. Interim medical history since our last visit reviewed. Allergies and medications reviewed and updated. Current Outpatient Prescriptions on File Prior to Visit  Medication Sig  . atorvastatin (LIPITOR) 80 MG tablet take 1 tablet by mouth at bedtime  . escitalopram (LEXAPRO) 20 MG tablet take 1 tablet by mouth once daily (NEED TO SCHEDULE OFFICE VISIT)  . metFORMIN (GLUCOPHAGE-XR) 500 MG 24 hr tablet Take 1 tablet (500 mg total) by mouth daily with breakfast.  . ONE TOUCH ULTRA TEST test strip USE TO CHECK BLOOD SUGAR TWO TIMES A DAY  . pantoprazole (PROTONIX) 40 MG tablet take 1 tablet by mouth once daily  . SUMAtriptan (IMITREX) 100 MG tablet Take 1 tablet by mouth as needed for migraine, may repeat in 2 hours x 1.  . traMADol (ULTRAM) 50 MG tablet Take 1 tablet (50 mg total) by mouth every 8 (eight) hours as needed.   No current facility-administered medications on file prior to visit.    Review of Systems Per HPI unless specifically indicated in ROS section     Objective:    BP 136/98 mmHg  Pulse 84  Temp(Src) 98.6 F (37 C) (Oral)  Wt 174 lb 8 oz (79.153 kg)   SpO2 96%  Wt Readings from Last 3 Encounters:  11/30/15 174 lb 8 oz (79.153 kg)  11/01/15 177 lb 12 oz (80.627 kg)  09/03/14 174 lb 12 oz (79.266 kg)    Physical Exam  Constitutional: She appears well-developed and well-nourished. No distress.  HENT:  Head: Normocephalic and atraumatic.  Right Ear: Hearing, tympanic membrane, external ear and ear canal normal.  Left Ear: Hearing, tympanic membrane, external ear and ear canal normal.  Nose: Mucosal edema present. No rhinorrhea. Right sinus exhibits no maxillary sinus tenderness and no frontal sinus tenderness. Left sinus exhibits no maxillary sinus tenderness and no frontal sinus tenderness.  Mouth/Throat: Uvula is midline, oropharynx is clear and moist and mucous membranes are normal. No oropharyngeal exudate, posterior oropharyngeal edema, posterior oropharyngeal erythema or tonsillar abscesses.  Eyes: Conjunctivae and EOM are normal. Pupils are equal, round, and reactive to light. No scleral icterus.  Neck: Normal range of motion. Neck supple. No thyromegaly present.  Cardiovascular: Normal rate, regular rhythm, normal heart sounds and intact distal pulses.   No murmur heard. Pulmonary/Chest: Effort normal and breath sounds normal. No respiratory distress. She has no wheezes. She has no rales.  Lungs clear  Lymphadenopathy:    She has no cervical adenopathy.  Skin: Skin is warm and dry. No rash noted.  Nursing note and vitals reviewed.  Lab Results  Component Value Date  HGBA1C 7.6* 10/27/2015       Assessment & Plan:   Problem List Items Addressed This Visit    Acute sinusitis - Primary    Acute sinusitis with laryngitis. Treat with zpack given progression of sxs and deterioration after initial improvement. cheratussin for cough. Update if not improving with treatment.      Relevant Medications   azithromycin (ZITHROMAX) 250 MG tablet   guaiFENesin-codeine (CHERATUSSIN AC) 100-10 MG/5ML syrup       Follow up  plan: Return if symptoms worsen or fail to improve.

## 2015-11-30 NOTE — Assessment & Plan Note (Signed)
Acute sinusitis with laryngitis. Treat with zpack given progression of sxs and deterioration after initial improvement. cheratussin for cough. Update if not improving with treatment.

## 2015-11-30 NOTE — Progress Notes (Signed)
Pre visit review using our clinic review tool, if applicable. No additional management support is needed unless otherwise documented below in the visit note. 

## 2016-01-23 ENCOUNTER — Other Ambulatory Visit: Payer: Self-pay | Admitting: *Deleted

## 2016-01-23 MED ORDER — ATORVASTATIN CALCIUM 80 MG PO TABS: 80.0000 mg | ORAL_TABLET | Freq: Every day | ORAL | Status: DC

## 2016-05-01 ENCOUNTER — Telehealth: Payer: Self-pay | Admitting: Family Medicine

## 2016-05-01 DIAGNOSIS — R928 Other abnormal and inconclusive findings on diagnostic imaging of breast: Secondary | ICD-10-CM

## 2016-05-01 NOTE — Telephone Encounter (Signed)
Pt called - she said that [facility] Pt number is [phone] Pt request cb when done so she can schedule appt  Thank you

## 2016-05-01 NOTE — Telephone Encounter (Signed)
Please clarify what she wants? Are we talking breast US, ? Diagnostic mammogram? Diagnosis?  Need more info.

## 2016-05-02 NOTE — Telephone Encounter (Signed)
When she had her mammogram, they "saw something" and a diagnostic breat us is needed.

## 2016-05-09 NOTE — Telephone Encounter (Signed)
Dr Ermalene SearingBedsole I [facility] needs [phone] uni left ultra sound  and img [id number] bilateral diagnostic mammgram orders added before she can make appointment Thanks

## 2016-05-09 NOTE — Telephone Encounter (Signed)
Orders entered as requested by [facility].

## 2016-05-09 NOTE — Telephone Encounter (Signed)
Left message asking pt to call office Orders have been placed for Karla Peterson Does pt want to make her own appointment or have us to make it If she wants us please find out any day any time???

## 2016-05-09 NOTE — Telephone Encounter (Signed)
Pt will make her own appointment

## 2016-05-09 NOTE — Addendum Note (Signed)
Addended by: Damita LackLORING, Aizen Duval S on: 05/09/2016 12:13 PM   Modules accepted: Orders

## 2016-05-18 ENCOUNTER — Ambulatory Visit
Admission: RE | Admit: 2016-05-18 | Discharge: 2016-05-18 | Disposition: A | Discharge status: Home / Self Care | Payer: 59 | Source: Ambulatory Visit | Attending: Family Medicine | Admitting: Family Medicine

## 2016-05-18 DIAGNOSIS — R921 Mammographic calcification found on diagnostic imaging of breast: Secondary | ICD-10-CM | POA: Insufficient documentation

## 2016-05-18 DIAGNOSIS — R928 Other abnormal and inconclusive findings on diagnostic imaging of breast: Secondary | ICD-10-CM

## 2016-07-31 ENCOUNTER — Other Ambulatory Visit: Payer: Self-pay | Admitting: Family Medicine

## 2016-10-26 ENCOUNTER — Ambulatory Visit (INDEPENDENT_AMBULATORY_CARE_PROVIDER_SITE_OTHER)
Admission: RE | Admit: 2016-10-26 | Discharge: 2016-10-26 | Disposition: A | Discharge status: Home / Self Care | Payer: 59 | Source: Ambulatory Visit | Attending: Family Medicine | Admitting: Family Medicine

## 2016-10-26 ENCOUNTER — Encounter: Payer: Self-pay | Admitting: Family Medicine

## 2016-10-26 ENCOUNTER — Ambulatory Visit (INDEPENDENT_AMBULATORY_CARE_PROVIDER_SITE_OTHER): Payer: 59 | Admitting: Family Medicine

## 2016-10-26 VITALS — BP 120/74 | HR 71 | Temp 98.3°F | Ht 62.5 in | Wt 176.5 lb

## 2016-10-26 DIAGNOSIS — Z23 Encounter for immunization: Secondary | ICD-10-CM

## 2016-10-26 DIAGNOSIS — R200 Anesthesia of skin: Secondary | ICD-10-CM | POA: Insufficient documentation

## 2016-10-26 DIAGNOSIS — G43009 Migraine without aura, not intractable, without status migrainosus: Secondary | ICD-10-CM

## 2016-10-26 DIAGNOSIS — M542 Cervicalgia: Secondary | ICD-10-CM

## 2016-10-26 DIAGNOSIS — M5010 Cervical disc disorder with radiculopathy, unspecified cervical region: Secondary | ICD-10-CM | POA: Insufficient documentation

## 2016-10-26 MED ORDER — DICLOFENAC SODIUM 75 MG PO TBEC: 75.0000 mg | DELAYED_RELEASE_TABLET | Freq: Two times a day (BID) | ORAL | 0 refills | Status: DC

## 2016-10-26 NOTE — Assessment & Plan Note (Signed)
Consider nerve conduction to determine if central or peripheral  Issue.

## 2016-10-26 NOTE — Assessment & Plan Note (Signed)
?   Cervical radiculopathy in light of hand numbness. No referred pain and spurling neg. Will start with NSAIDs and X-rayt neck.

## 2016-10-26 NOTE — Addendum Note (Signed)
Addended by: Damita LackLORING, DONNA S on: 10/26/2016 10:01 AM   Modules accepted: Orders

## 2016-10-26 NOTE — Progress Notes (Signed)
Pre visit review using our clinic review tool, if applicable. No additional management support is needed unless otherwise documented below in the visit note. 

## 2016-10-26 NOTE — Patient Instructions (Signed)
Start diclofenac 75 mg twice daily x 2 weeks.  Stop at lab for X-ray.

## 2016-10-26 NOTE — Progress Notes (Signed)
Subjective:    Patient ID: Arletha GrippeSandra G Riviere, female    DOB: [birth year], 60 y.o.   MRN: [id number]  HPI    60 year old female pt with history of migraines, poorly controlled DM presents with  new onset neck pain, low and upper back pain, body aches and numbness in hands mainly right.   She report this began with back pain, neck pain, numbness x 2 months. Numbness in both hands, mainly right. No trigger like movement of neck for numbness. No radiation of pain to arms or legs. She feels she drops more things in hand. No fall, no new injury. 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 Pain in neck is the greatest. 2/10 on pain scale.. Get 4-5/10 at night.  She has seen massage therapist. Helped lossen her up some. Fellt better afterward.   She has been having 3 headaches this week. Improved some with 650 mg acetaminophen.  HX: 2004 lumbar microdisketomy for herniated disc.  Review of Systems  Constitutional: Negative for fatigue and fever.  HENT: Negative for ear pain.   Eyes: Negative for pain.  Respiratory: Negative for chest tightness and shortness of breath.   Cardiovascular: Negative for chest pain, palpitations and leg swelling.  Gastrointestinal: Negative for abdominal pain.  Genitourinary: Negative for dysuria.       Objective:   Physical Exam  Constitutional: She is oriented to person, place, and time. Vital signs are normal. She appears well-developed and well-nourished. She is cooperative.  Non-toxic appearance. She does not appear ill. No distress.  HENT:  Head: Normocephalic.  Right Ear: Hearing, tympanic membrane, external ear and ear canal normal. Tympanic membrane is not erythematous, not retracted and not bulging.  Left Ear: Hearing, tympanic membrane, external ear and ear canal normal. Tympanic membrane is not erythematous, not retracted and not bulging.  Nose: No mucosal edema or rhinorrhea. Right sinus exhibits no maxillary sinus tenderness and no frontal  sinus tenderness. Left sinus exhibits no maxillary sinus tenderness and no frontal sinus tenderness.  Mouth/Throat: Uvula is midline, oropharynx is clear and moist and mucous membranes are normal.  Eyes: Conjunctivae, EOM and lids are normal. Pupils are equal, round, and reactive to light. Lids are everted and swept, no foreign bodies found.  Neck: Trachea normal and normal range of motion. Neck supple. Carotid bruit is not present. No thyroid mass and no thyromegaly present.  Cardiovascular: Normal rate, regular rhythm, S1 normal, S2 normal, normal heart sounds, intact distal pulses and normal pulses.  Exam reveals no gallop and no friction rub.   No murmur heard. Pulmonary/Chest: Effort normal and breath sounds normal. No tachypnea. No respiratory distress. She has no decreased breath sounds. She has no wheezes. She has no rhonchi. She has no rales.  Abdominal: Soft. Normal appearance and bowel sounds are normal. There is no tenderness.  Musculoskeletal:       Cervical back: She exhibits decreased range of motion, tenderness and bony tenderness.  Neg spurling  Neurological: She is alert and oriented to person, place, and time. She has normal strength and normal reflexes. No cranial nerve deficit or sensory deficit. She exhibits normal muscle tone. She displays a negative Romberg sign. Coordination and gait normal. GCS eye subscore is 4. GCS verbal subscore is 5. GCS motor subscore is 6.  Nml cerebellar exam   No papilledema   neg tinel and pahlen  Skin: Skin is warm, dry and intact. No rash noted.  Psychiatric: She has a normal mood and  affect. Her speech is normal and behavior is normal. Judgment and thought content normal. Her mood appears not anxious. Cognition and memory are normal. Cognition and memory are not impaired. She does not exhibit a depressed mood. She exhibits normal recent memory and normal remote memory.          Assessment & Plan:

## 2016-11-05 ENCOUNTER — Other Ambulatory Visit (INDEPENDENT_AMBULATORY_CARE_PROVIDER_SITE_OTHER): Payer: 59

## 2016-11-05 ENCOUNTER — Telehealth: Payer: Self-pay | Admitting: Family Medicine

## 2016-11-05 DIAGNOSIS — E1165 Type 2 diabetes mellitus with hyperglycemia: Secondary | ICD-10-CM

## 2016-11-05 DIAGNOSIS — E782 Mixed hyperlipidemia: Secondary | ICD-10-CM

## 2016-11-05 DIAGNOSIS — E559 Vitamin D deficiency, unspecified: Secondary | ICD-10-CM

## 2016-11-05 LAB — COMPREHENSIVE METABOLIC PANEL
ALK PHOS: 118 U/L — AB (ref 39–117)
ALT: 95 U/L — ABNORMAL HIGH (ref 0–35)
AST: 64 U/L — ABNORMAL HIGH (ref 0–37)
Albumin: 4.3 g/dL (ref 3.5–5.2)
BUN: 14 mg/dL (ref 6–23)
CHLORIDE: 101 meq/L (ref 96–112)
CO2: 29 mEq/L (ref 19–32)
Calcium: 9.1 mg/dL (ref 8.4–10.5)
Creatinine, Ser: 0.57 mg/dL (ref 0.40–1.20)
GFR: 114.39 mL/min (ref >60.00)
GLUCOSE: 160 mg/dL — AB (ref 70–99)
POTASSIUM: 4.4 meq/L (ref 3.5–5.1)
SODIUM: 139 meq/L (ref 135–145)
TOTAL PROTEIN: 7.7 g/dL (ref 6.0–8.3)
Total Bilirubin: 0.6 mg/dL (ref 0.2–1.2)

## 2016-11-05 LAB — LIPID PANEL
CHOL/HDL RATIO: 4
Cholesterol: 174 mg/dL (ref 0–200)
HDL: 42.8 mg/dL (ref >39.00)
LDL Cholesterol: 103 mg/dL — ABNORMAL HIGH (ref 0–99)
NonHDL: 131.31
TRIGLYCERIDES: 141 mg/dL (ref 0.0–149.0)
VLDL: 28.2 mg/dL (ref 0.0–40.0)

## 2016-11-05 LAB — MICROALBUMIN / CREATININE URINE RATIO
CREATININE, U: 173.7 mg/dL
MICROALB/CREAT RATIO: 1.6 mg/g (ref 0.0–30.0)
Microalb, Ur: 2.8 mg/dL — ABNORMAL HIGH (ref 0.0–1.9)

## 2016-11-05 LAB — VITAMIN D 25 HYDROXY (VIT D DEFICIENCY, FRACTURES): VITD: 36.78 ng/mL (ref 30.00–100.00)

## 2016-11-05 LAB — HEMOGLOBIN A1C: HEMOGLOBIN A1C: 7.9 % — AB (ref 4.6–6.5)

## 2016-11-05 NOTE — Telephone Encounter (Signed)
-----   Message from Baldomero LamyNatasha C Chavers sent at 11/02/2016  8:20 AM EST ----- Regarding: 2 week f/u labs Mon 12/11, need orders. Thanks! :-) Please order  future f/u labs for pt's upcoming lab appt. Thanks Rodney Boozeasha

## 2016-11-09 ENCOUNTER — Ambulatory Visit (INDEPENDENT_AMBULATORY_CARE_PROVIDER_SITE_OTHER): Payer: 59 | Admitting: Family Medicine

## 2016-11-09 ENCOUNTER — Encounter: Payer: Self-pay | Admitting: Family Medicine

## 2016-11-09 DIAGNOSIS — E782 Mixed hyperlipidemia: Secondary | ICD-10-CM | POA: Diagnosis not present

## 2016-11-09 DIAGNOSIS — E1165 Type 2 diabetes mellitus with hyperglycemia: Secondary | ICD-10-CM | POA: Diagnosis not present

## 2016-11-09 DIAGNOSIS — K760 Fatty (change of) liver, not elsewhere classified: Secondary | ICD-10-CM | POA: Diagnosis not present

## 2016-11-09 DIAGNOSIS — E559 Vitamin D deficiency, unspecified: Secondary | ICD-10-CM

## 2016-11-09 DIAGNOSIS — M5010 Cervical disc disorder with radiculopathy, unspecified cervical region: Secondary | ICD-10-CM

## 2016-11-09 MED ORDER — METFORMIN HCL ER 500 MG PO TB24: 1000.0000 mg | ORAL_TABLET | Freq: Every day | ORAL | 11 refills | Status: DC

## 2016-11-09 MED ORDER — DICLOFENAC SODIUM 75 MG PO TBEC: 75.0000 mg | DELAYED_RELEASE_TABLET | Freq: Two times a day (BID) | ORAL | 0 refills | Status: DC

## 2016-11-09 NOTE — Patient Instructions (Addendum)
Use diclofenac as needed for neck pain.  If worsening call for possible MRI and referral.  Follow BP at home.. Goal < 140/90... Call  if BPs elevated in 1-2 week. May need to hold diclofenac if causing in crease BP. Increase metformin to 2 tabs daily in AM.  Make yearly eye exam.  Follow up with Dr. Rhea Peterson for  LFT.  Get back to regular exercise.

## 2016-11-09 NOTE — Assessment & Plan Note (Signed)
Increase metformin to 1000 mg daily Encouraged exercise, weight loss, healthy eating habits.

## 2016-11-09 NOTE — Assessment & Plan Note (Signed)
High risk.. On high dose statin. LDL almost at goal.

## 2016-11-09 NOTE — Assessment & Plan Note (Signed)
At goal on supplement. 

## 2016-11-09 NOTE — Assessment & Plan Note (Addendum)
Improved with anti-inflammatory.  if not continuing to improved consider MRI and referral.

## 2016-11-09 NOTE — Assessment & Plan Note (Signed)
Improved with lifestyle changes.. But not resolved.  Saw Dr. Rhea BeltonPyrtle in past.

## 2016-11-09 NOTE — Progress Notes (Signed)
Subjective:    Patient ID: Karla Peterson, female    DOB: 1956-01-17, 60 y.o.   MRN: [id number]  HPI   60 year old female presents for follow up neck pain ( ongoing > 2 months). As well as DM follow up.  She was  Dx on 10/26/2016 with possible  cervical radiculopathy.   Cervical film: IMPRESSION: There is moderately severe disc space narrowing at C4-5 and C6-7. Moderate disc space narrowing is noted at C5-6. There are anterior osteophytes at C4, C5, and C6. There is facet hypertrophy with exit foraminal narrowing at C4-5, C5-6, and C6-7 bilaterally and to a lesser extent at C3-4 bilaterally. No fracture or spondylolisthesis. Reversal of lordotic curvature is most likely due to muscle spasm.  Started on diclofenac 75 mg daily.  Today she reports:  Pain is better but still sore. She is using diclofenac as needed. Able to sleep at night.  Still with some numbness off and on.  HX: 2004 lumbar microdisketomy for herniated disc  Diabetes:   Poor control.. Worsening in last year.  On metformin  Lab Results  Component Value Date   HGBA1C 7.9 (H) 11/05/2016  Using medications without difficulties: Hypoglycemic episodes:None Hyperglycemic episodes: yes Feet problems: none Blood Sugars averaging: FBS 150-180 eye exam within last year:due   Has no history of BP elevation.  BP Readings from Last 3 Encounters:  11/09/16 (!) 152/88  10/26/16 120/74  11/30/15 (!) 136/98   LFTs decreased from last year.  Occ using tylenol.  Elevated Cholesterol: LLD is almost at goal < 100 on atorvastatin 80mg  .. High risk CAD. Using medications without problems: none Muscle aches: none Diet compliance: moderate Exercise:None Other complaints:   Review of Systems  Constitutional: Negative for fatigue and fever.  HENT: Negative for ear pain.   Eyes: Negative for pain.  Respiratory: Negative for chest tightness and shortness of breath.   Cardiovascular: Negative for chest pain,  palpitations and leg swelling.  Gastrointestinal: Negative for abdominal pain.  Genitourinary: Negative for dysuria.       Objective:   Physical Exam  Constitutional: She is oriented to person, place, and time. Vital signs are normal. She appears well-developed and well-nourished. She is cooperative.  Non-toxic appearance. She does not appear ill. No distress.  HENT:  Head: Normocephalic.  Right Ear: Hearing, tympanic membrane, external ear and ear canal normal. Tympanic membrane is not erythematous, not retracted and not bulging.  Left Ear: Hearing, tympanic membrane, external ear and ear canal normal. Tympanic membrane is not erythematous, not retracted and not bulging.  Nose: No mucosal edema or rhinorrhea. Right sinus exhibits no maxillary sinus tenderness and no frontal sinus tenderness. Left sinus exhibits no maxillary sinus tenderness and no frontal sinus tenderness.  Mouth/Throat: Uvula is midline, oropharynx is clear and moist and mucous membranes are normal.  Eyes: Conjunctivae, EOM and lids are normal. Pupils are equal, round, and reactive to light. Lids are everted and swept, no foreign bodies found.  Neck: Trachea normal and normal range of motion. Neck supple. Carotid bruit is not present. No thyroid mass and no thyromegaly present.  Cardiovascular: Normal rate, regular rhythm, S1 normal, S2 normal, normal heart sounds, intact distal pulses and normal pulses.  Exam reveals no gallop and no friction rub.   No murmur heard. Pulmonary/Chest: Effort normal and breath sounds normal. No tachypnea. No respiratory distress. She has no decreased breath sounds. She has no wheezes. She has no rhonchi. She has no rales.  Abdominal: Soft.  Normal appearance and bowel sounds are normal. There is no tenderness.  Musculoskeletal:       Cervical back: She exhibits decreased range of motion, tenderness and bony tenderness.  Neg spurling  Neurological: She is alert and oriented to person, place,  and time. She has normal strength and normal reflexes. No cranial nerve deficit or sensory deficit. She exhibits normal muscle tone. She displays a negative Romberg sign. Coordination and gait normal. GCS eye subscore is 4. GCS verbal subscore is 5. GCS motor subscore is 6.  Nml cerebellar exam   No papilledema   neg tinel and pahlen  Skin: Skin is warm, dry and intact. No rash noted.  Psychiatric: She has a normal mood and affect. Her speech is normal and behavior is normal. Judgment and thought content normal. Her mood appears not anxious. Cognition and memory are normal. Cognition and memory are not impaired. She does not exhibit a depressed mood. She exhibits normal recent memory and normal remote memory.    Diabetic foot exam: Normal inspection No skin breakdown No calluses  Normal DP pulses Normal sensation to light touch and monofilament Nails normal      Assessment & Plan:

## 2016-11-09 NOTE — Progress Notes (Signed)
Pre visit review using our clinic review tool, if applicable. No additional management support is needed unless otherwise documented below in the visit note. 

## 2016-11-27 ENCOUNTER — Other Ambulatory Visit: Payer: Self-pay | Admitting: Family Medicine

## 2016-12-10 ENCOUNTER — Telehealth: Payer: Self-pay | Admitting: *Deleted

## 2016-12-10 NOTE — Telephone Encounter (Signed)
Patient left a voicemail stating that she was seen about 2 weeks ago with leg pain. Patient stated that the pain medication given to her has not helped and she needs something stronger to take while she works.

## 2016-12-11 ENCOUNTER — Encounter: Payer: Self-pay | Admitting: Family Medicine

## 2016-12-11 ENCOUNTER — Ambulatory Visit (INDEPENDENT_AMBULATORY_CARE_PROVIDER_SITE_OTHER): Payer: 59 | Admitting: Family Medicine

## 2016-12-11 ENCOUNTER — Ambulatory Visit (INDEPENDENT_AMBULATORY_CARE_PROVIDER_SITE_OTHER)
Admission: RE | Admit: 2016-12-11 | Discharge: 2016-12-11 | Disposition: A | Discharge status: Home / Self Care | Payer: 59 | Source: Ambulatory Visit | Attending: Family Medicine | Admitting: Family Medicine

## 2016-12-11 VITALS — BP 138/70 | HR 80 | Temp 98.4°F | Ht 62.5 in | Wt 176.0 lb

## 2016-12-11 DIAGNOSIS — M5417 Radiculopathy, lumbosacral region: Secondary | ICD-10-CM

## 2016-12-11 DIAGNOSIS — M5010 Cervical disc disorder with radiculopathy, unspecified cervical region: Secondary | ICD-10-CM | POA: Diagnosis not present

## 2016-12-11 DIAGNOSIS — M545 Low back pain, unspecified: Secondary | ICD-10-CM | POA: Insufficient documentation

## 2016-12-11 DIAGNOSIS — M5416 Radiculopathy, lumbar region: Secondary | ICD-10-CM

## 2016-12-11 DIAGNOSIS — M25561 Pain in right knee: Secondary | ICD-10-CM

## 2016-12-11 DIAGNOSIS — I700 Atherosclerosis of aorta: Secondary | ICD-10-CM | POA: Insufficient documentation

## 2016-12-11 DIAGNOSIS — M25461 Effusion, right knee: Secondary | ICD-10-CM | POA: Diagnosis not present

## 2016-12-11 MED ORDER — PREDNISONE 20 MG PO TABS: ORAL_TABLET | ORAL | 0 refills | Status: DC

## 2016-12-11 NOTE — Progress Notes (Signed)
Subjective:    Patient ID: Karla Peterson, female    DOB: [birth year], 61 y.o.   MRN: [id number]  HPI   61 year old female with poorly controlled DM presents for new onset leg pain. She has recently been on trip to [place] ( [place] in [place] to [place]), walking up stairway right leg gave out on her. No pain. No further issue until 2 weeks ago. At that point she began to have throbbing in right low back , buttock  Radiates to right calf, ankle and posterior knee. Swelling in right ankle.  No redness, no swelling. Yesterday felt  like right knee twisted.. Now more pain in right anterior knee as well.  Limping on right.  No new numbness.  diclofenac did nhelp this issue either.. Was on for her neck  She has history of cervical disc disorder with radiculopathy. At last OV neck pain was treated  diclofenac for pain. Diclofenac BID was unhelpful.  Still having neck pain but some better.    Review of Systems  Constitutional: Negative for fatigue and fever.  HENT: Negative for ear pain.   Eyes: Negative for pain.  Respiratory: Negative for chest tightness and shortness of breath.   Cardiovascular: Negative for chest pain, palpitations and leg swelling.  Gastrointestinal: Negative for abdominal pain.  Genitourinary: Negative for dysuria.       Objective:   Physical Exam  Constitutional: She is oriented to person, place, and time. Vital signs are normal. She appears well-developed and well-nourished. She is cooperative.  Non-toxic appearance. She does not appear ill. No distress.  HENT:  Head: Normocephalic.  Right Ear: Hearing, tympanic membrane, external ear and ear canal normal. Tympanic membrane is not erythematous, not retracted and not bulging.  Left Ear: Hearing, tympanic membrane, external ear and ear canal normal. Tympanic membrane is not erythematous, not retracted and not bulging.  Nose: No mucosal edema or rhinorrhea. Right sinus exhibits no maxillary sinus tenderness and no frontal  sinus tenderness. Left sinus exhibits no maxillary sinus tenderness and no frontal sinus tenderness.  Mouth/Throat: Uvula is midline, oropharynx is clear and moist and mucous membranes are normal.  Eyes: Conjunctivae, EOM and lids are normal. Pupils are equal, round, and reactive to light. Lids are everted and swept, no foreign bodies found.  Neck: Trachea normal and normal range of motion. Neck supple. Carotid bruit is not present. No thyroid mass and no thyromegaly present.  Cardiovascular: Normal rate, regular rhythm, S1 normal, S2 normal, normal heart sounds, intact distal pulses and normal pulses.  Exam reveals no gallop and no friction rub.   No murmur heard. Pulmonary/Chest: Effort normal and breath sounds normal. No tachypnea. No respiratory distress. She has no decreased breath sounds. She has no wheezes. She has no rhonchi. She has no rales.  Abdominal: Soft. Normal appearance and bowel sounds are normal. There is no tenderness.  Musculoskeletal:       Right knee: She exhibits decreased range of motion and swelling. She exhibits no bony tenderness and normal meniscus. Tenderness found. Medial joint line and lateral joint line tenderness noted. No MCL, no LCL and no patellar tendon tenderness noted.       Cervical back: She exhibits decreased range of motion and tenderness. She exhibits no bony tenderness.       Lumbar back: She exhibits tenderness and bony tenderness. She exhibits normal range of motion.  Low back ttp on right, sciatic notch, neg SLR bilateral  Right knee popping with ext and  flexion.  Neurological: She is alert and oriented to person, place, and time. She has normal strength. She displays no atrophy. No cranial nerve deficit or sensory deficit. She exhibits normal muscle tone. Gait abnormal.  Skin: Skin is warm, dry and intact. No rash noted.  Psychiatric: Her speech is normal and behavior is normal. Judgment and thought content normal. Her mood appears not anxious.  Cognition and memory are normal. She does not exhibit a depressed mood.          Assessment & Plan:

## 2016-12-11 NOTE — Progress Notes (Signed)
Pre visit review using our clinic review tool, if applicable. No additional management support is needed unless otherwise documented below in the visit note. 

## 2016-12-11 NOTE — Assessment & Plan Note (Signed)
New injury with abn gait from sciatica. ? Arthritis flare versus meniscal injury.  Eval with X-ray.  Treat with pred as well.  If not improving consider further eval.

## 2016-12-11 NOTE — Assessment & Plan Note (Signed)
Improved some. Will follow. If not continuing to resolve consider MRI neck for further eval.

## 2016-12-11 NOTE — Telephone Encounter (Signed)
Spoke to patient and was advised that she is having leg pain and neck pain, but only mention in the previous message leg pain. Patient stated that she is on her way to the office now to be seen.

## 2016-12-11 NOTE — Patient Instructions (Addendum)
We will call with  X-ray results.  Start prednisone taper.  Follow up in 2 weeks for re-eval 30 min OV.

## 2016-12-11 NOTE — Telephone Encounter (Signed)
I thought she has neck pain, not leg pain.   If neck pain.. We should image her neck further with MRI.   If leg pain.. Needs follow up for further eval.

## 2016-12-11 NOTE — Assessment & Plan Note (Signed)
Initial pain likely due to sciatica.. Treat with pred taper. Eval with X-ray to rule out  Fracture.

## 2017-01-01 ENCOUNTER — Telehealth: Payer: Self-pay | Admitting: Family Medicine

## 2017-01-01 ENCOUNTER — Ambulatory Visit: Payer: 59 | Admitting: Family Medicine

## 2017-01-01 NOTE — Telephone Encounter (Signed)
Patient did not come in for their appointment today for 2 week follow up.  Please let me know if patient needs to be contacted immediately for follow up or no follow up needed. °

## 2017-01-01 NOTE — Telephone Encounter (Signed)
No need for follow up

## 2017-01-03 ENCOUNTER — Ambulatory Visit (INDEPENDENT_AMBULATORY_CARE_PROVIDER_SITE_OTHER): Payer: 59 | Admitting: Family Medicine

## 2017-01-03 ENCOUNTER — Encounter: Payer: Self-pay | Admitting: Family Medicine

## 2017-01-03 VITALS — BP 146/78 | HR 87 | Temp 98.3°F | Ht 62.5 in | Wt 176.2 lb

## 2017-01-03 DIAGNOSIS — M25561 Pain in right knee: Secondary | ICD-10-CM | POA: Diagnosis not present

## 2017-01-03 DIAGNOSIS — M5416 Radiculopathy, lumbar region: Secondary | ICD-10-CM

## 2017-01-03 DIAGNOSIS — M5417 Radiculopathy, lumbosacral region: Secondary | ICD-10-CM

## 2017-01-03 DIAGNOSIS — M5010 Cervical disc disorder with radiculopathy, unspecified cervical region: Secondary | ICD-10-CM

## 2017-01-03 MED ORDER — TRAMADOL HCL 50 MG PO TABS: 50.0000 mg | ORAL_TABLET | Freq: Three times a day (TID) | ORAL | 0 refills | Status: DC | PRN

## 2017-01-03 NOTE — Progress Notes (Signed)
Pre visit review using our clinic review tool, if applicable. No additional management support is needed unless otherwise documented below in the visit note. 

## 2017-01-03 NOTE — Progress Notes (Signed)
Subjective:    Patient ID: Karla Peterson, female    DOB: 11/30/1955, 61 y.o.   MRN: [id number]  HPI    61 year old female presents for  Follow up lumbar back pain with radiculopathy,  Neck pain and  Right knee pain.   She is S/P course of prednisone.  X-ray at last OV showed:  Disc space loss L5-S1, and extensive facet arthropathy.  right knee mild OA, no fracture.   She reports her neck is much better.   Still with right low back pain and  right knee pain. She feels that pain is  severe.  Increase in pain when on her feet. No improvement at all with with the prednisone. Pain increased with bending knee. Pain in knee is greater than in low back. No clicking  No popping,  more swollen than other knee in medial knee.. Area of swelling is sore.    No weakness no new numbness.. Does have lateral numbness in thigh.  Tylenol and ibuprofen, diclofenac  Do not help with pain. Has tolerated tramadol in past for pain.    Review of Systems  Constitutional: Negative for fatigue and fever.  HENT: Negative for ear pain.   Eyes: Negative for pain.  Respiratory: Negative for chest tightness and shortness of breath.   Cardiovascular: Negative for chest pain, palpitations and leg swelling.  Gastrointestinal: Negative for abdominal pain.  Genitourinary: Negative for dysuria.       Objective:   Physical Exam  Constitutional: She is oriented to person, place, and time. Vital signs are normal. She appears well-developed and well-nourished. She is cooperative.  Non-toxic appearance. She does not appear ill. No distress.  HENT:  Head: Normocephalic.  Right Ear: Hearing, tympanic membrane, external ear and ear canal normal. Tympanic membrane is not erythematous, not retracted and not bulging.  Left Ear: Hearing, tympanic membrane, external ear and ear canal normal. Tympanic membrane is not erythematous, not retracted and not bulging.  Nose: No mucosal edema or rhinorrhea. Right sinus  exhibits no maxillary sinus tenderness and no frontal sinus tenderness. Left sinus exhibits no maxillary sinus tenderness and no frontal sinus tenderness.  Mouth/Throat: Uvula is midline, oropharynx is clear and moist and mucous membranes are normal.  Eyes: Conjunctivae, EOM and lids are normal. Pupils are equal, round, and reactive to light. Lids are everted and swept, no foreign bodies found.  Neck: Trachea normal and normal range of motion. Neck supple. Carotid bruit is not present. No thyroid mass and no thyromegaly present.  Cardiovascular: Normal rate, regular rhythm, S1 normal, S2 normal, normal heart sounds, intact distal pulses and normal pulses.  Exam reveals no gallop and no friction rub.   No murmur heard. Pulmonary/Chest: Effort normal and breath sounds normal. No tachypnea. No respiratory distress. She has no decreased breath sounds. She has no wheezes. She has no rhonchi. She has no rales.  Abdominal: Soft. Normal appearance and bowel sounds are normal. There is no tenderness.  Musculoskeletal:       Right knee: She exhibits decreased range of motion and swelling. She exhibits no bony tenderness and normal meniscus. Tenderness found. Medial joint line and lateral joint line tenderness noted. No MCL, no LCL and no patellar tendon tenderness noted.       Cervical back: She exhibits normal range of motion, no tenderness and no bony tenderness.       Lumbar back: She exhibits tenderness and bony tenderness. She exhibits normal range of motion.  Low back  ttp on right, sciatic notch, neg SLR bilateral  Right knee popping with ext and flexion.  Slightly positive MCMurray's.  Mild swelling medial joint line.   Neurological: She is alert and oriented to person, place, and time. She has normal strength. She displays no atrophy. No cranial nerve deficit or sensory deficit. She exhibits normal muscle tone. Gait abnormal.  Skin: Skin is warm, dry and intact. No rash noted.  Psychiatric: Her  speech is normal and behavior is normal. Judgment and thought content normal. Her mood appears not anxious. Cognition and memory are normal. She does not exhibit a depressed mood.          Assessment & Plan:

## 2017-01-03 NOTE — Assessment & Plan Note (Signed)
Improved with prednisone taper  

## 2017-01-03 NOTE — Assessment & Plan Note (Addendum)
Heat and home PT.

## 2017-01-03 NOTE — Patient Instructions (Signed)
Use tramadol for pain. Stop at front desk to set up MRI of right knee. Heat on low back and gentle low back stretches.

## 2017-01-03 NOTE — Assessment & Plan Note (Addendum)
Not clearly  Associated with lumbar back issues. Mild OA on X-ray.. Pain out of proportion with exam.  No improvement with NSAIDS, prednisone, tyelnol, home PT. Concern for meniscal tear.  Eval with MRi.  Tramadol for pain.

## 2017-01-12 ENCOUNTER — Ambulatory Visit
Admission: RE | Admit: 2017-01-12 | Discharge: 2017-01-12 | Disposition: A | Discharge status: Home / Self Care | Payer: 59 | Source: Ambulatory Visit | Attending: Family Medicine | Admitting: Family Medicine

## 2017-01-12 DIAGNOSIS — X58XXXA Exposure to other specified factors, initial encounter: Secondary | ICD-10-CM | POA: Diagnosis not present

## 2017-01-12 DIAGNOSIS — M25461 Effusion, right knee: Secondary | ICD-10-CM | POA: Insufficient documentation

## 2017-01-12 DIAGNOSIS — M23303 Other meniscus derangements, unspecified medial meniscus, right knee: Secondary | ICD-10-CM | POA: Diagnosis not present

## 2017-01-12 DIAGNOSIS — M94261 Chondromalacia, right knee: Secondary | ICD-10-CM | POA: Diagnosis not present

## 2017-01-12 DIAGNOSIS — S83241A Other tear of medial meniscus, current injury, right knee, initial encounter: Secondary | ICD-10-CM | POA: Diagnosis not present

## 2017-01-12 DIAGNOSIS — M7121 Synovial cyst of popliteal space [Baker], right knee: Secondary | ICD-10-CM | POA: Insufficient documentation

## 2017-01-12 DIAGNOSIS — M25561 Pain in right knee: Secondary | ICD-10-CM | POA: Diagnosis not present

## 2017-01-14 ENCOUNTER — Telehealth: Payer: Self-pay | Admitting: Family Medicine

## 2017-01-14 DIAGNOSIS — M25561 Pain in right knee: Secondary | ICD-10-CM

## 2017-01-14 DIAGNOSIS — S83206D Unspecified tear of unspecified meniscus, current injury, right knee, subsequent encounter: Secondary | ICD-10-CM

## 2017-01-14 NOTE — Telephone Encounter (Signed)
-----   Message from Damita Lackonna S Loring, CMA sent at 01/14/2017  4:03 PM EST ----- Ms. Karla Peterson notified as instructed by telephone.  She is agreeable to ortho referral.  No preference of location.

## 2017-01-21 DIAGNOSIS — M25561 Pain in right knee: Secondary | ICD-10-CM | POA: Diagnosis not present

## 2017-01-27 ENCOUNTER — Other Ambulatory Visit: Payer: Self-pay | Admitting: Family Medicine

## 2017-01-30 ENCOUNTER — Telehealth: Payer: Self-pay

## 2017-01-30 DIAGNOSIS — R928 Other abnormal and inconclusive findings on diagnostic imaging of breast: Secondary | ICD-10-CM

## 2017-01-30 NOTE — Telephone Encounter (Signed)
Pt left v/m [facility]; last diagnostic mammogram 05/18/16.

## 2017-02-04 ENCOUNTER — Telehealth: Payer: Self-pay | Admitting: Family Medicine

## 2017-02-04 ENCOUNTER — Other Ambulatory Visit: Payer: 59

## 2017-02-04 DIAGNOSIS — E782 Mixed hyperlipidemia: Secondary | ICD-10-CM

## 2017-02-04 DIAGNOSIS — E1165 Type 2 diabetes mellitus with hyperglycemia: Secondary | ICD-10-CM

## 2017-02-04 DIAGNOSIS — E559 Vitamin D deficiency, unspecified: Secondary | ICD-10-CM

## 2017-02-04 NOTE — Telephone Encounter (Signed)
-----   Message from Alvina Chouerri J Walsh sent at 01/23/2017  4:17 PM EST ----- Regarding: [facility] Lab orders for DM f/u labs

## 2017-02-06 NOTE — Telephone Encounter (Signed)
Dr Ermalene Searingbedsole Can you add ultra soundS img [id number] and img [id number]

## 2017-02-08 ENCOUNTER — Other Ambulatory Visit (INDEPENDENT_AMBULATORY_CARE_PROVIDER_SITE_OTHER): Payer: 59

## 2017-02-08 ENCOUNTER — Ambulatory Visit: Payer: 59 | Admitting: Family Medicine

## 2017-02-08 DIAGNOSIS — E1165 Type 2 diabetes mellitus with hyperglycemia: Secondary | ICD-10-CM

## 2017-02-08 LAB — HEMOGLOBIN A1C: HEMOGLOBIN A1C: 8.3 % — AB (ref 4.6–6.5)

## 2017-02-08 NOTE — Addendum Note (Signed)
Addended byKerby Nora: BEDSOLE, AMY E on: 02/08/2017 07:10 AM   Modules accepted: Orders

## 2017-02-15 ENCOUNTER — Ambulatory Visit (INDEPENDENT_AMBULATORY_CARE_PROVIDER_SITE_OTHER): Payer: 59 | Admitting: Family Medicine

## 2017-02-15 ENCOUNTER — Encounter: Payer: Self-pay | Admitting: Family Medicine

## 2017-02-15 DIAGNOSIS — E1165 Type 2 diabetes mellitus with hyperglycemia: Secondary | ICD-10-CM

## 2017-02-15 DIAGNOSIS — E669 Obesity, unspecified: Secondary | ICD-10-CM | POA: Diagnosis not present

## 2017-02-15 MED ORDER — METFORMIN HCL ER 500 MG PO TB24
1500.0000 mg | ORAL_TABLET | Freq: Every day | ORAL | 11 refills | Status: DC
Start: 2017-02-15 — End: 2018-03-05

## 2017-02-15 NOTE — Assessment & Plan Note (Signed)
Inadequate control.. Increase metfromin to 3 tabs daily. Follow up in 3 months.

## 2017-02-15 NOTE — Progress Notes (Signed)
Pre visit review using our clinic review tool, if applicable. No additional management support is needed unless otherwise documented below in the visit note. 

## 2017-02-15 NOTE — Progress Notes (Signed)
   Subjective:    Patient ID: Karla Peterson, female    DOB: Jun 06, 1956, 61 y.o.   MRN: [id number]  HPI    61 year old female presents for follow up DM.  Recent treatment for lumbar pain with radiculopathy with pred taper as well as right knee pain.  MRI knee 12/2015:  Meniscal tear.. [facility]  She has had improvement with steroid injection.  Using advil prn pain off and on.   Diabetes:   Worsened control with prednisone. Pt currently on metformin 1000 mg daily. No SE .Marland Kitchen. No diarrhea or stomach issue. Lab Results  Component Value Date   HGBA1C 8.3 (H) 02/08/2017  Using medications without difficulties: Hypoglycemic episodes: 60 Hyperglycemic episodes: yes Feet problems: none Blood Sugars averaging: FBS 152-202 eye exam within last year:  Moderate diet control.  She has not been exercising regularly.    Review of Systems  Constitutional: Negative for fatigue and fever.  HENT: Negative for ear pain.   Eyes: Negative for pain.  Respiratory: Negative for chest tightness and shortness of breath.   Cardiovascular: Negative for chest pain, palpitations and leg swelling.  Gastrointestinal: Negative for abdominal pain.  Genitourinary: Negative for dysuria.   Body mass index is 31.9 kg/m.     Objective:   Physical Exam  Constitutional: Vital signs are normal. She appears well-developed and well-nourished. She is cooperative.  Non-toxic appearance. She does not appear ill. No distress.  Central obesity  HENT:  Head: Normocephalic.  Right Ear: Hearing, tympanic membrane, external ear and ear canal normal. Tympanic membrane is not erythematous, not retracted and not bulging.  Left Ear: Hearing, tympanic membrane, external ear and ear canal normal. Tympanic membrane is not erythematous, not retracted and not bulging.  Nose: No mucosal edema or rhinorrhea. Right sinus exhibits no maxillary sinus tenderness and no frontal sinus tenderness. Left sinus exhibits no maxillary  sinus tenderness and no frontal sinus tenderness.  Mouth/Throat: Uvula is midline, oropharynx is clear and moist and mucous membranes are normal.  Eyes: Conjunctivae, EOM and lids are normal. Pupils are equal, round, and reactive to light. Lids are everted and swept, no foreign bodies found.  Neck: Trachea normal and normal range of motion. Neck supple. Carotid bruit is not present. No thyroid mass and no thyromegaly present.  Cardiovascular: Normal rate, regular rhythm, S1 normal, S2 normal, normal heart sounds, intact distal pulses and normal pulses.  Exam reveals no gallop and no friction rub.   No murmur heard. Pulmonary/Chest: Effort normal and breath sounds normal. No tachypnea. No respiratory distress. She has no decreased breath sounds. She has no wheezes. She has no rhonchi. She has no rales.  Abdominal: Soft. Normal appearance and bowel sounds are normal. There is no tenderness.  Neurological: She is alert.  Skin: Skin is warm, dry and intact. No rash noted.  Psychiatric: Her speech is normal and behavior is normal. Judgment and thought content normal. Her mood appears not anxious. Cognition and memory are normal. She does not exhibit a depressed mood.    Diabetic foot exam: Normal inspection No skin breakdown No calluses  Normal DP pulses Normal sensation to light touch and monofilament Nails normal      Assessment & Plan:

## 2017-02-15 NOTE — Patient Instructions (Signed)
Decrease carbs in diet, increase exercise as able. Water exercise is a great idea. Increase metformin to 1 tab in AM and 2 tabs at night.

## 2017-02-15 NOTE — Assessment & Plan Note (Signed)
Counseled on healthy eating and regular exercise to decrease comorbidity associated with obesity.

## 2017-03-02 IMAGING — DX DG KNEE COMPLETE 4+V*R*
5 series · 5 of 5 positions shown · non-contrast
Comparison: None.

CLINICAL DATA: Twisted right knee, acute pain

EXAM:
RIGHT KNEE - COMPLETE 4+ VIEW

[knee ap]
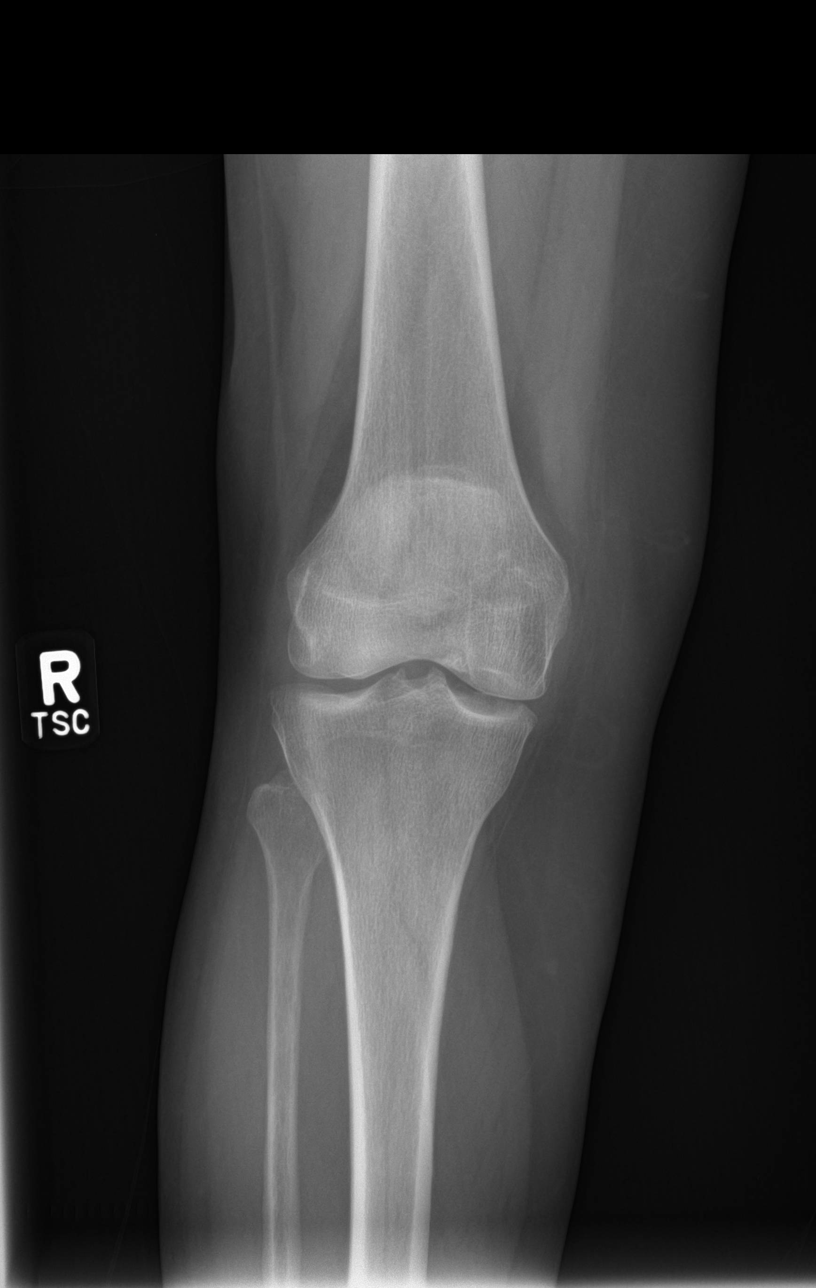

[knee lat]
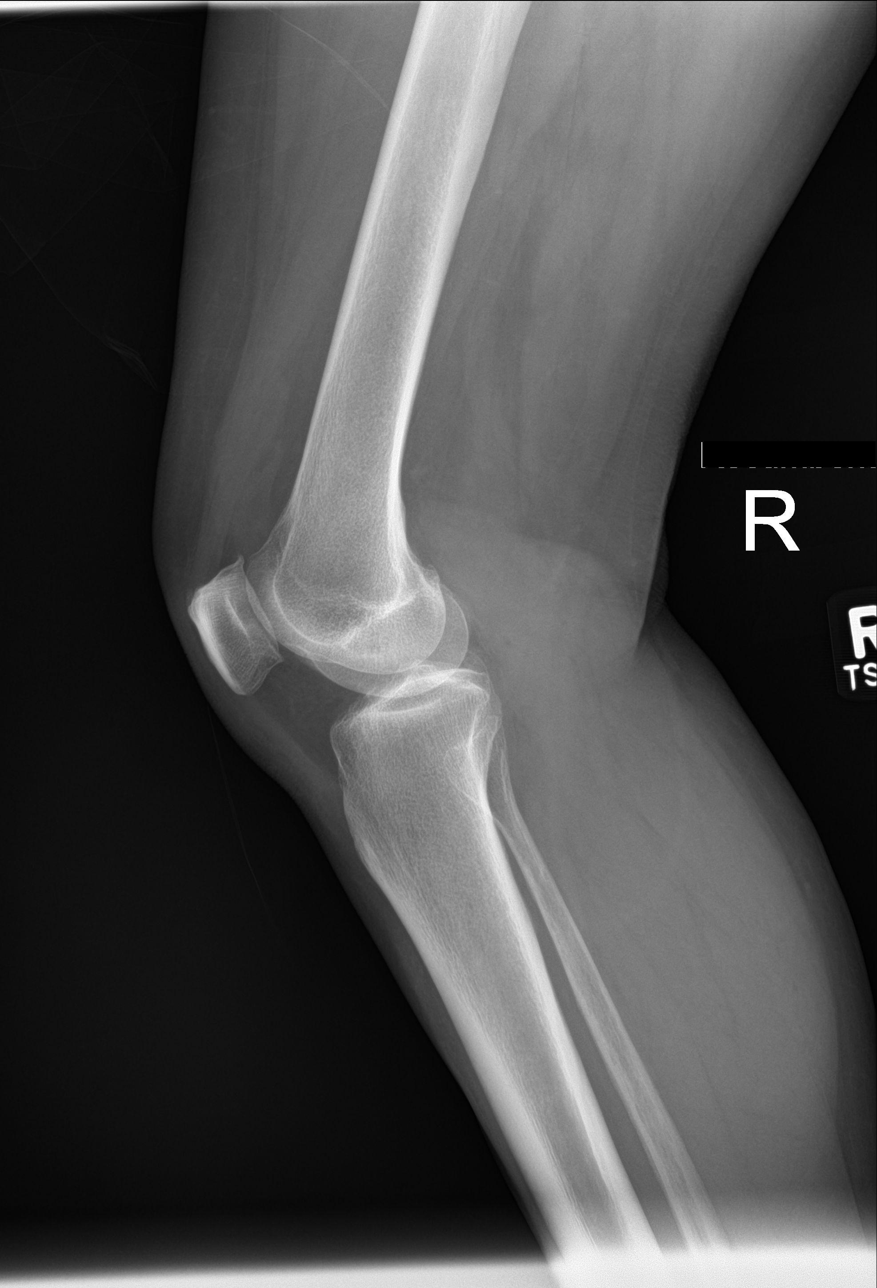

[patella skyline]
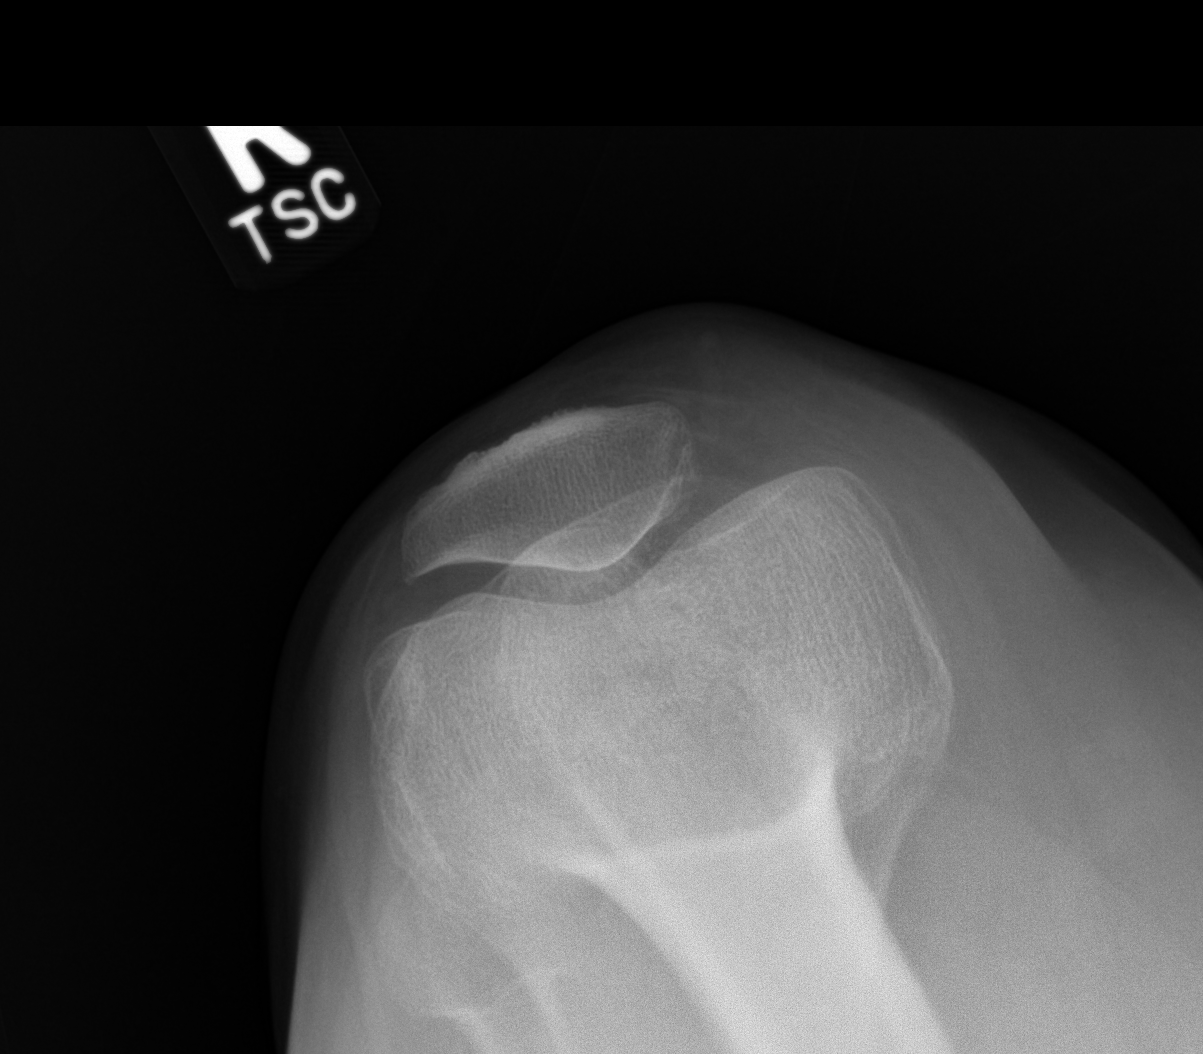

[knee obl (1 of 2)]
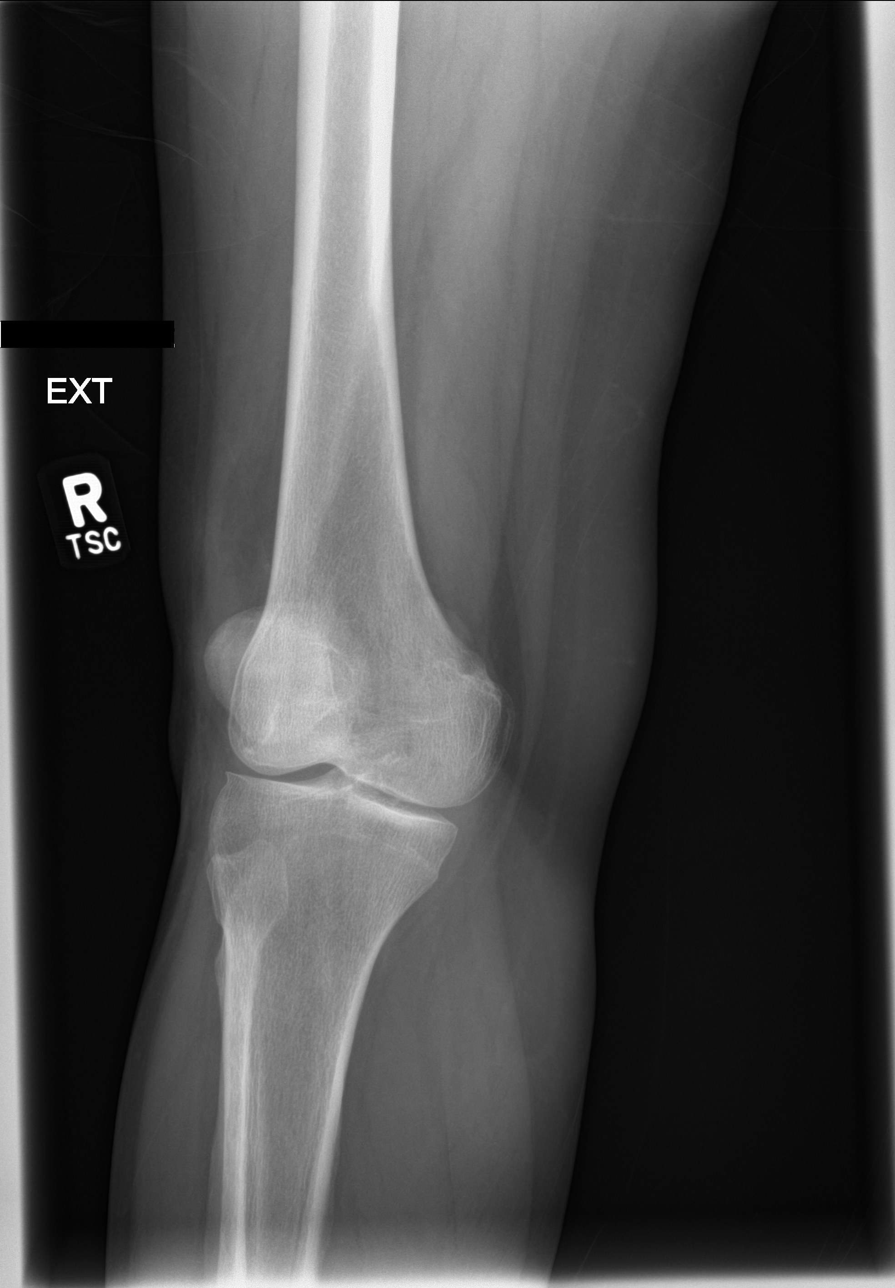

[knee obl (2 of 2)]
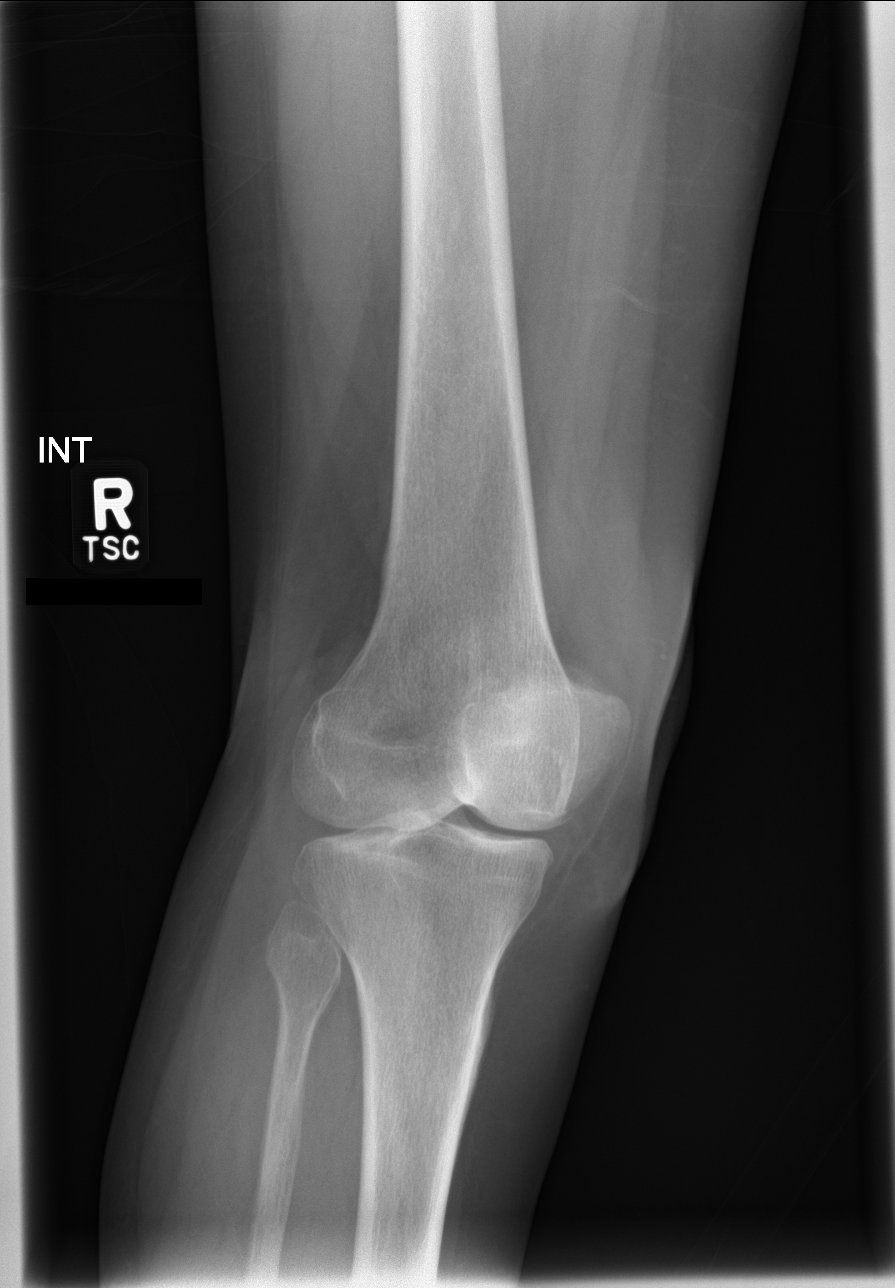

[5 of 5 positions shown; findings below may reference images not displayed]

FINDINGS: Five views of the right knee submitted. Mild narrowing of medial
joint compartment. No acute fracture or subluxation. Narrowing of
patellofemoral joint space. Mild spurring of patella. Small joint
effusion.
IMPRESSION: No acute fracture or subluxation. Small joint effusion.
Osteoarthritic changes as described above.

## 2017-03-08 ENCOUNTER — Other Ambulatory Visit: Payer: 59

## 2017-03-08 ENCOUNTER — Inpatient Hospital Stay: Admission: RE | Admit: 2017-03-08 | Payer: 59 | Source: Ambulatory Visit

## 2017-03-22 ENCOUNTER — Ambulatory Visit
Admission: RE | Admit: 2017-03-22 | Discharge: 2017-03-22 | Disposition: A | Discharge status: Home / Self Care | Payer: 59 | Source: Ambulatory Visit | Attending: Family Medicine | Admitting: Family Medicine

## 2017-03-22 DIAGNOSIS — R928 Other abnormal and inconclusive findings on diagnostic imaging of breast: Secondary | ICD-10-CM

## 2017-04-05 ENCOUNTER — Other Ambulatory Visit: Payer: Self-pay

## 2017-04-05 DIAGNOSIS — Z8249 Family history of ischemic heart disease and other diseases of the circulatory system: Secondary | ICD-10-CM

## 2017-04-18 ENCOUNTER — Ambulatory Visit (INDEPENDENT_AMBULATORY_CARE_PROVIDER_SITE_OTHER)
Admission: RE | Admit: 2017-04-18 | Discharge: 2017-04-18 | Disposition: A | Discharge status: Home / Self Care | Payer: Self-pay | Source: Ambulatory Visit | Attending: Cardiovascular Disease | Admitting: Cardiovascular Disease

## 2017-04-18 DIAGNOSIS — Z8249 Family history of ischemic heart disease and other diseases of the circulatory system: Secondary | ICD-10-CM

## 2017-05-19 ENCOUNTER — Telehealth: Payer: Self-pay | Admitting: Family Medicine

## 2017-05-19 DIAGNOSIS — E782 Mixed hyperlipidemia: Secondary | ICD-10-CM

## 2017-05-19 DIAGNOSIS — E559 Vitamin D deficiency, unspecified: Secondary | ICD-10-CM

## 2017-05-19 DIAGNOSIS — E1165 Type 2 diabetes mellitus with hyperglycemia: Secondary | ICD-10-CM

## 2017-05-19 NOTE — Telephone Encounter (Signed)
-----   Message from Baldomero LamyNatasha C Chavers sent at 05/10/2017  2:19 PM EDT ----- Regarding: dm f/u labs Mon 6/25, need orders. Thanks! Please order future dm f/u labs for pt's upcoming lab appt. Thanks Rodney Boozeasha

## 2017-05-20 ENCOUNTER — Other Ambulatory Visit: Payer: 59

## 2017-05-23 ENCOUNTER — Ambulatory Visit: Payer: 59 | Admitting: Family Medicine

## 2017-07-04 ENCOUNTER — Other Ambulatory Visit: Payer: Self-pay | Admitting: Family Medicine

## 2017-07-24 DIAGNOSIS — H2513 Age-related nuclear cataract, bilateral: Secondary | ICD-10-CM | POA: Diagnosis not present

## 2017-07-24 LAB — HM DIABETES EYE EXAM

## 2017-07-30 ENCOUNTER — Encounter: Payer: Self-pay | Admitting: Family Medicine

## 2017-08-04 ENCOUNTER — Other Ambulatory Visit: Payer: Self-pay | Admitting: Family Medicine

## 2017-08-29 ENCOUNTER — Other Ambulatory Visit: Payer: Self-pay | Admitting: Family Medicine

## 2017-08-29 NOTE — Telephone Encounter (Signed)
Last office visit 02/15/2017.  AVS states to follow up DM in 3 months.  Karla Peterson is scheduled to see you 08/30/2017 for stomach issues/sweating/not feeling well.  Refill?

## 2017-08-30 ENCOUNTER — Ambulatory Visit (INDEPENDENT_AMBULATORY_CARE_PROVIDER_SITE_OTHER): Payer: 59 | Admitting: Family Medicine

## 2017-08-30 ENCOUNTER — Encounter: Payer: Self-pay | Admitting: Family Medicine

## 2017-08-30 VITALS — BP 140/80 | HR 77 | Temp 98.6°F | Ht 62.5 in | Wt 172.8 lb

## 2017-08-30 DIAGNOSIS — M5010 Cervical disc disorder with radiculopathy, unspecified cervical region: Secondary | ICD-10-CM | POA: Diagnosis not present

## 2017-08-30 DIAGNOSIS — E1165 Type 2 diabetes mellitus with hyperglycemia: Secondary | ICD-10-CM | POA: Diagnosis not present

## 2017-08-30 DIAGNOSIS — R220 Localized swelling, mass and lump, head: Secondary | ICD-10-CM | POA: Diagnosis not present

## 2017-08-30 DIAGNOSIS — R221 Localized swelling, mass and lump, neck: Secondary | ICD-10-CM

## 2017-08-30 DIAGNOSIS — R1111 Vomiting without nausea: Secondary | ICD-10-CM

## 2017-08-30 DIAGNOSIS — K760 Fatty (change of) liver, not elsewhere classified: Secondary | ICD-10-CM | POA: Diagnosis not present

## 2017-08-30 DIAGNOSIS — M542 Cervicalgia: Secondary | ICD-10-CM | POA: Insufficient documentation

## 2017-08-30 DIAGNOSIS — K219 Gastro-esophageal reflux disease without esophagitis: Secondary | ICD-10-CM | POA: Diagnosis not present

## 2017-08-30 DIAGNOSIS — R1110 Vomiting, unspecified: Secondary | ICD-10-CM | POA: Insufficient documentation

## 2017-08-30 DIAGNOSIS — Z23 Encounter for immunization: Secondary | ICD-10-CM

## 2017-08-30 LAB — COMPREHENSIVE METABOLIC PANEL
ALT: 68 U/L — ABNORMAL HIGH (ref 0–35)
AST: 52 U/L — ABNORMAL HIGH (ref 0–37)
Albumin: 4.7 g/dL (ref 3.5–5.2)
Alkaline Phosphatase: 114 U/L (ref 39–117)
BUN: 20 mg/dL (ref 6–23)
CALCIUM: 9.4 mg/dL (ref 8.4–10.5)
CHLORIDE: 97 meq/L (ref 96–112)
CO2: 29 mEq/L (ref 19–32)
Creatinine, Ser: 0.52 mg/dL (ref 0.40–1.20)
GFR: 126.84 mL/min (ref >60.00)
Glucose, Bld: 123 mg/dL — ABNORMAL HIGH (ref 70–99)
POTASSIUM: 4.3 meq/L (ref 3.5–5.1)
SODIUM: 135 meq/L (ref 135–145)
Total Bilirubin: 0.7 mg/dL (ref 0.2–1.2)
Total Protein: 7.9 g/dL (ref 6.0–8.3)

## 2017-08-30 LAB — LIPID PANEL
CHOL/HDL RATIO: 4
CHOLESTEROL: 185 mg/dL (ref 0–200)
HDL: 46.8 mg/dL (ref >39.00)
NonHDL: 138.1
Triglycerides: 205 mg/dL — ABNORMAL HIGH (ref 0.0–149.0)
VLDL: 41 mg/dL — AB (ref 0.0–40.0)

## 2017-08-30 LAB — HEMOGLOBIN A1C: HEMOGLOBIN A1C: 8 % — AB (ref 4.6–6.5)

## 2017-08-30 LAB — LDL CHOLESTEROL, DIRECT: LDL DIRECT: 116 mg/dL

## 2017-08-30 NOTE — Patient Instructions (Addendum)
Please stop at the front desk to set up referral and MRI cervical spine Please stop at the lab to have labs drawn.

## 2017-08-30 NOTE — Assessment & Plan Note (Signed)
Likely cause of neck pain and grinding.  Eval with MRI c-spine to determine extent.. May be a candidate for steroid injections.  Hold NSAIDs  Given current GI issues. May consider muscle relaxant prn given muscle spasm.

## 2017-08-30 NOTE — Assessment & Plan Note (Signed)
Unusual emesis not associated with nausea.. Chronic.. May be psychogenic from anxiety, but she does continue to have poorly controlled GERD on PPI.    refer to GI for consideration of diabetic gastroparesis, GERD, or hiatal hernia as cause.

## 2017-08-30 NOTE — Assessment & Plan Note (Signed)
Due for lab re-eval. 

## 2017-08-30 NOTE — Progress Notes (Signed)
Subjective:    Patient ID: Karla Peterson, female    DOB: 01-27-1956, 61 y.o.   MRN: [id number]  HPI    61 year old female with history of poorly controlled DM, chronic fatigue presents with new onset of multiple issues.  She has chosen the most concerning to go over today.  1. Knot on back of head noted several months, increase some is size more recently.  Pain over the knot in back of head, headache in last few weeks associated with the knot. Pain in neck.Marland Kitchen Hear grinding when moving head. When turning head hears something in left ear.  800 mg ibuprofen did not help the head or neck pain.  Warm shower helps the head/neck pain. 10/2016 cervical spine x-ray..Multilevel arthropathy. No fracture or spondylolisthesis. Reversal of lordotic curvature is most likely due to muscle spasm.  Hx of migraine but  This does not feel like a migraine.. Has not taken imitrex.  2. Emesis off and on,seems associated with stress/nerves occ fatty foods. She does not feel anxious... ongin for her whole life. Seems to increase in last few weeks..3 times a week. No nausea. No associated with headache.   She does take protonix 40 mg daily for GERD  Has never had a endoscopy.   Blood pressure 140/80, pulse 77, temperature 98.6 F (37 C), temperature source Oral, height 5' 2.5" (1.588 m), weight 172 lb 12 oz (78.4 kg).  Review of Systems  Constitutional: Negative for fatigue and fever.  HENT: Negative for ear pain.   Eyes: Negative for pain.  Respiratory: Negative for chest tightness and shortness of breath.   Cardiovascular: Negative for chest pain, palpitations and leg swelling.  Gastrointestinal: Negative for abdominal pain.  Genitourinary: Negative for dysuria.       Objective:   Physical Exam  Constitutional: Vital signs are normal. She appears well-developed and well-nourished. She is cooperative.  Non-toxic appearance. She does not appear ill. No distress.  HENT:  Head: Normocephalic.    Right Ear: Hearing, tympanic membrane, external ear and ear canal normal. Tympanic membrane is not erythematous, not retracted and not bulging.  Left Ear: Hearing, tympanic membrane, external ear and ear canal normal. Tympanic membrane is not erythematous, not retracted and not bulging.  Nose: No mucosal edema or rhinorrhea. Right sinus exhibits no maxillary sinus tenderness and no frontal sinus tenderness. Left sinus exhibits no maxillary sinus tenderness and no frontal sinus tenderness.  Mouth/Throat: Uvula is midline, oropharynx is clear and moist and mucous membranes are normal.  Eyes: Pupils are equal, round, and reactive to light. Conjunctivae, EOM and lids are normal. Lids are everted and swept, no foreign bodies found.  Neck: Trachea normal and normal range of motion. Neck supple. Carotid bruit is not present. No thyroid mass and no thyromegaly present.  Cardiovascular: Normal rate, regular rhythm, S1 normal, S2 normal, normal heart sounds, intact distal pulses and normal pulses.  Exam reveals no gallop and no friction rub.   No murmur heard. Pulmonary/Chest: Effort normal and breath sounds normal. No tachypnea. No respiratory distress. She has no decreased breath sounds. She has no wheezes. She has no rhonchi. She has no rales.  Abdominal: Soft. Normal appearance and bowel sounds are normal. There is no tenderness.  Musculoskeletal:       Cervical back: She exhibits decreased range of motion, tenderness and bony tenderness.  Posterior left occiput with 5 cm x 4 cm nonmobile mass, mobile fatty tissue overlying.  Neg spurling's  Neurological: She is  alert.  Skin: Skin is warm, dry and intact. No rash noted.  Psychiatric: Her speech is normal and behavior is normal. Judgment and thought content normal. Her mood appears not anxious. Cognition and memory are normal. She does not exhibit a depressed mood.          Assessment & Plan:

## 2017-08-30 NOTE — Assessment & Plan Note (Signed)
Due for lab eval for A1C.Marland Kitchen Pt will return to disucss control in more detail.

## 2017-08-30 NOTE — Assessment & Plan Note (Signed)
Significant source of headache pain per pt. May simply be skull prominence with overlying fatty tissue.  Will eval with MRI as evaluating C-spine

## 2017-09-03 ENCOUNTER — Other Ambulatory Visit: Payer: Self-pay | Admitting: Family Medicine

## 2017-09-03 ENCOUNTER — Ambulatory Visit
Admission: RE | Admit: 2017-09-03 | Discharge: 2017-09-03 | Disposition: A | Discharge status: Home / Self Care | Payer: 59 | Source: Ambulatory Visit | Attending: Family Medicine | Admitting: Family Medicine

## 2017-09-03 DIAGNOSIS — R221 Localized swelling, mass and lump, neck: Secondary | ICD-10-CM | POA: Diagnosis present

## 2017-09-03 DIAGNOSIS — M47892 Other spondylosis, cervical region: Secondary | ICD-10-CM | POA: Insufficient documentation

## 2017-09-03 DIAGNOSIS — M4802 Spinal stenosis, cervical region: Secondary | ICD-10-CM | POA: Diagnosis not present

## 2017-09-03 DIAGNOSIS — M542 Cervicalgia: Secondary | ICD-10-CM | POA: Diagnosis not present

## 2017-09-03 MED ORDER — GADOBENATE DIMEGLUMINE 529 MG/ML IV SOLN: 15.0000 mL | Freq: Once | INTRAVENOUS | Status: DC | PRN

## 2017-09-17 ENCOUNTER — Telehealth: Payer: Self-pay | Admitting: Family Medicine

## 2017-09-17 ENCOUNTER — Ambulatory Visit: Payer: 59 | Admitting: Family Medicine

## 2017-09-17 NOTE — Telephone Encounter (Signed)
Copied from CRM [id number]. Topic: Quick Communication - Appointment Cancellation >> Sep 16, 2017  3:49 PM Arlyss Gandyichardson, Taren N, NT wrote: Patient called to cancel appointment scheduled for 09/17/17. Patient has not rescheduled their appointment.   Route to department's PEC pool.

## 2017-09-17 NOTE — Telephone Encounter (Signed)
Noted  

## 2017-10-04 ENCOUNTER — Other Ambulatory Visit: Payer: Self-pay | Admitting: Family Medicine

## 2018-01-08 ENCOUNTER — Other Ambulatory Visit: Payer: Self-pay | Admitting: Family Medicine

## 2018-01-22 ENCOUNTER — Telehealth: Payer: Self-pay | Admitting: Family Medicine

## 2018-01-22 DIAGNOSIS — R928 Other abnormal and inconclusive findings on diagnostic imaging of breast: Secondary | ICD-10-CM

## 2018-01-22 NOTE — Telephone Encounter (Signed)
Copied from CRM [id number]. Topic: Quick Communication - See Telephone Encounter >> Jan 22, 2018  2:09 PM Cipriano BunkerLambe, Annette S wrote: CRM for notification. See Telephone encounter for:   pt [facility]  Please call pt. When referral has been sent  01/22/18.

## 2018-01-23 NOTE — Telephone Encounter (Signed)
Karla DavenportSandra notified referral has been ordered so she should be able to call and schedule her diagnostic mammogram.

## 2018-02-03 ENCOUNTER — Other Ambulatory Visit: Payer: Self-pay | Admitting: Family Medicine

## 2018-02-04 ENCOUNTER — Ambulatory Visit
Admission: RE | Admit: 2018-02-04 | Discharge: 2018-02-04 | Disposition: A | Discharge status: Home / Self Care | Payer: 59 | Source: Ambulatory Visit | Attending: Family Medicine | Admitting: Family Medicine

## 2018-02-04 DIAGNOSIS — R921 Mammographic calcification found on diagnostic imaging of breast: Secondary | ICD-10-CM | POA: Insufficient documentation

## 2018-02-04 DIAGNOSIS — R928 Other abnormal and inconclusive findings on diagnostic imaging of breast: Secondary | ICD-10-CM | POA: Diagnosis present

## 2018-03-05 ENCOUNTER — Telehealth: Payer: Self-pay | Admitting: Family Medicine

## 2018-03-05 NOTE — Telephone Encounter (Signed)
Last office visit 08/30/2017.  AVS states return in about 2 weeks (around 09/13/2017) for follow up DM and other issues.  No future appointments.. Refill?

## 2018-03-06 NOTE — Telephone Encounter (Signed)
Will refill once ...  appt must be made ASAP for further refills.

## 2018-03-06 NOTE — Telephone Encounter (Signed)
Robin,   Please schedule follow up on diabetes with labs prior for Dr. Ermalene SearingBedsole.

## 2018-03-07 NOTE — Telephone Encounter (Signed)
Lab 4/26 Dr Ermalene Searingbedsole 4/30 Pt aware

## 2018-03-18 ENCOUNTER — Telehealth: Payer: Self-pay | Admitting: Family Medicine

## 2018-03-18 DIAGNOSIS — E1165 Type 2 diabetes mellitus with hyperglycemia: Secondary | ICD-10-CM

## 2018-03-18 NOTE — Telephone Encounter (Signed)
-----   Message from Alvina Chouerri J Walsh sent at 03/12/2018 12:36 PM EDT ----- Regarding: Lab orders for Friday, 4.26.19 Lab orders for a DM f/u appt

## 2018-03-21 ENCOUNTER — Other Ambulatory Visit (INDEPENDENT_AMBULATORY_CARE_PROVIDER_SITE_OTHER): Payer: 59

## 2018-03-21 DIAGNOSIS — E1165 Type 2 diabetes mellitus with hyperglycemia: Secondary | ICD-10-CM | POA: Diagnosis not present

## 2018-03-21 LAB — LIPID PANEL
CHOLESTEROL: 310 mg/dL — AB (ref 0–200)
HDL: 42.3 mg/dL (ref >39.00)
NonHDL: 268.08
Total CHOL/HDL Ratio: 7
Triglycerides: 303 mg/dL — ABNORMAL HIGH (ref 0.0–149.0)
VLDL: 60.6 mg/dL — ABNORMAL HIGH (ref 0.0–40.0)

## 2018-03-21 LAB — LDL CHOLESTEROL, DIRECT: LDL DIRECT: 217 mg/dL

## 2018-03-21 LAB — COMPREHENSIVE METABOLIC PANEL
ALBUMIN: 4.4 g/dL (ref 3.5–5.2)
ALK PHOS: 107 U/L (ref 39–117)
ALT: 95 U/L — AB (ref 0–35)
AST: 69 U/L — ABNORMAL HIGH (ref 0–37)
BILIRUBIN TOTAL: 0.6 mg/dL (ref 0.2–1.2)
BUN: 14 mg/dL (ref 6–23)
CO2: 30 mEq/L (ref 19–32)
Calcium: 9.1 mg/dL (ref 8.4–10.5)
Chloride: 100 mEq/L (ref 96–112)
Creatinine, Ser: 0.57 mg/dL (ref 0.40–1.20)
GFR: 113.88 mL/min (ref >60.00)
GLUCOSE: 134 mg/dL — AB (ref 70–99)
Potassium: 4.6 mEq/L (ref 3.5–5.1)
SODIUM: 138 meq/L (ref 135–145)
TOTAL PROTEIN: 7.7 g/dL (ref 6.0–8.3)

## 2018-03-21 LAB — MICROALBUMIN / CREATININE URINE RATIO
Creatinine,U: 98.4 mg/dL
Microalb Creat Ratio: 1.1 mg/g (ref 0.0–30.0)
Microalb, Ur: 1.1 mg/dL (ref 0.0–1.9)

## 2018-03-21 LAB — HEMOGLOBIN A1C: HEMOGLOBIN A1C: 7.8 % — AB (ref 4.6–6.5)

## 2018-03-24 ENCOUNTER — Emergency Department
Admission: EM | Admit: 2018-03-24 | Discharge: 2018-03-24 | Disposition: A | Discharge status: Home / Self Care | Payer: 59 | Attending: Emergency Medicine | Admitting: Emergency Medicine

## 2018-03-24 ENCOUNTER — Encounter: Payer: Self-pay | Admitting: Emergency Medicine

## 2018-03-24 DIAGNOSIS — Z79899 Other long term (current) drug therapy: Secondary | ICD-10-CM | POA: Diagnosis not present

## 2018-03-24 DIAGNOSIS — E119 Type 2 diabetes mellitus without complications: Secondary | ICD-10-CM | POA: Diagnosis not present

## 2018-03-24 DIAGNOSIS — Y93E5 Activity, floor mopping and cleaning: Secondary | ICD-10-CM | POA: Diagnosis not present

## 2018-03-24 DIAGNOSIS — Y999 Unspecified external cause status: Secondary | ICD-10-CM | POA: Diagnosis not present

## 2018-03-24 DIAGNOSIS — S61214A Laceration without foreign body of right ring finger without damage to nail, initial encounter: Secondary | ICD-10-CM

## 2018-03-24 DIAGNOSIS — W25XXXA Contact with sharp glass, initial encounter: Secondary | ICD-10-CM | POA: Diagnosis not present

## 2018-03-24 DIAGNOSIS — S6991XA Unspecified injury of right wrist, hand and finger(s), initial encounter: Secondary | ICD-10-CM | POA: Diagnosis present

## 2018-03-24 DIAGNOSIS — Y92009 Unspecified place in unspecified non-institutional (private) residence as the place of occurrence of the external cause: Secondary | ICD-10-CM | POA: Diagnosis not present

## 2018-03-24 MED ORDER — TRAMADOL HCL 50 MG PO TABS
50.0000 mg | ORAL_TABLET | Freq: Once | ORAL | Status: AC
  Administered 2018-03-24: 50 mg via ORAL
  Filled 2018-03-24: qty 1

## 2018-03-24 MED ORDER — CEPHALEXIN 500 MG PO CAPS: 500.0000 mg | ORAL_CAPSULE | Freq: Four times a day (QID) | ORAL | 0 refills | Status: DC

## 2018-03-24 MED ORDER — TRAMADOL HCL 50 MG PO TABS: 50.0000 mg | ORAL_TABLET | Freq: Four times a day (QID) | ORAL | 0 refills | Status: DC | PRN

## 2018-03-24 NOTE — ED Triage Notes (Signed)
Patient was at home doing window maintenence and it slammed on her right ring finger, bleeding is controlled at this time, pt is co 5/10 throbbing pain.

## 2018-03-24 NOTE — ED Provider Notes (Signed)
[facility] Emergency Department Provider Note  ____________________________________________  Time seen: Approximately 8:51 PM  I have reviewed the triage vital signs and the nursing notes.   HISTORY  Chief Complaint Finger Injury (Right ring finger)    HPI Karla Peterson is a 62 y.o. female who presents the emergency department complaining of laceration to the fourth digit of her right hand.  Patient reports that she was cleaning the windows in her house, went to undo on the windows and it fell pinching her finger between the windowsill in the window.  Patient reports that the skin was caught until her husband could come and open the window.  Patient reports a ragged laceration to the lateral aspect of the fourth digit right hand.  Patient is able to flex and extend the digit.  Patient's tetanus shot is up-to-date.  No other injury or complaint.  No medications prior to arrival.  Past Medical History:  Diagnosis Date  . Fatty liver disease, nonalcoholic 01/26/2012  . Foot fracture, left 2009   Jones fracture- no surgery  . GERD (gastroesophageal reflux disease)   . Hyperlipidemia   . Poorly controlled diabetes mellitus (HCC) 10/04/2011    Patient Active Problem List   Diagnosis Date Noted  . Neck pain 08/30/2017  . Localized swelling, mass and lump, head, left occiput 08/30/2017  . Emesis 08/30/2017  . Right knee pain 12/11/2016  . Lumbar back pain with radiculopathy affecting right lower extremity 12/11/2016  . Aortic atherosclerosis (HCC) 12/11/2016  . Cervical disc disorder with radiculopathy of cervical region 10/26/2016  . Bilateral hand numbness 10/26/2016  . Microalbuminuria 11/01/2015  . Chronic fatigue 11/01/2015  . Obesity (BMI 30-39.9) 11/01/2015  . Fatty liver disease, nonalcoholic 01/26/2012  . Elevated LFTs 10/04/2011  . Poorly controlled diabetes mellitus (HCC) 10/04/2011  . Allergic rhinitis 10/04/2011  . Vitamin D deficiency  06/17/2009  . Hyperlipidemia 04/26/2009  . DEPRESSION 04/26/2009  . Common migraine 04/26/2009  . GERD 04/26/2009    Past Surgical History:  Procedure Laterality Date  . BACK SURGERY  2001   herniated disk, lumbar spine  . BREAST BIOPSY Right neg   2013  . TONSILLECTOMY      Prior to Admission medications   Medication Sig Start Date End Date Taking? Authorizing Provider  atorvastatin (LIPITOR) 80 MG tablet TAKE 1 TABLET BY MOUTH AT BEDTIME 01/08/18   Bedsole, Amy E, MD  cephALEXin (KEFLEX) 500 MG capsule Take 1 capsule (500 mg total) by mouth 4 (four) times daily. 03/24/18   Cuthriell, Delorise Royals, PA-C  escitalopram (LEXAPRO) 20 MG tablet take 1 tablet by mouth once daily 10/04/17   Bedsole, Amy E, MD  metFORMIN (GLUCOPHAGE-XR) 500 MG 24 hr tablet TAKE 3 TABLETS BY MOUTH DAILY WITH BREAKFAST 03/06/18   Bedsole, Amy E, MD  ONE TOUCH ULTRA TEST test strip USE TO CHECK BLOOD SUGAR TWO TIMES A DAY 07/21/15   Bedsole, Amy E, MD  pantoprazole (PROTONIX) 40 MG tablet take 1 tablet by mouth once daily 10/04/17   Bedsole, Amy E, MD  SUMAtriptan (IMITREX) 100 MG tablet Take 1 tablet by mouth as needed for migraine, may repeat in 2 hours x 1. 02/10/13   Bedsole, Amy E, MD  traMADol (ULTRAM) 50 MG tablet Take 1 tablet (50 mg total) by mouth every 6 (six) hours as needed. 03/24/18   Cuthriell, Delorise Royals, PA-C    Allergies Patient has no known allergies.  Family History  Problem Relation Age of Onset  .  Breast cancer Mother   . Aneurysm Father        aortic  . Heart disease Father        CABG  . Liver disease Father        jaundice, Hep C after transfusion    Social History Social History   Tobacco Use  . Smoking status: Never Smoker  . Smokeless tobacco: Never Used  Substance Use Topics  . Alcohol use: Yes    Comment: occassionally  . Drug use: No     Review of Systems  Constitutional: No fever/chills Eyes: No visual changes.  Cardiovascular: no chest pain. Respiratory: no  cough. No SOB. Gastrointestinal: No abdominal pain.  No nausea, no vomiting.   Musculoskeletal: Negative for musculoskeletal pain. Skin: Positive for laceration to the fourth digit of the right hand Neurological: Negative for headaches, focal weakness or numbness. 10-point ROS otherwise negative.  ____________________________________________   PHYSICAL EXAM:  VITAL SIGNS: ED Triage Vitals  Enc Vitals Group     BP 03/24/18 2033 (!) 146/68     Pulse Rate 03/24/18 2033 76     Resp 03/24/18 2033 18     Temp 03/24/18 2033 97.7 F (36.5 C)     Temp Source 03/24/18 2033 Oral     SpO2 03/24/18 2033 96 %     Weight 03/24/18 2039 170 lb (77.1 kg)     Height 03/24/18 2039  (1.575 m)     Head Circumference --      Peak Flow --      Pain Score 03/24/18 2037 4     Pain Loc --      Pain Edu? --      Excl. in GC? --      Constitutional: Alert and oriented. Well appearing and in no acute distress. Eyes: Conjunctivae are normal. PERRL. EOMI. Head: Atraumatic. Neck: No stridor.    Cardiovascular: Normal rate, regular rhythm. Normal S1 and S2.  Good peripheral circulation. Respiratory: Normal respiratory effort without tachypnea or retractions. Lungs CTAB. Good air entry to the bases with no decreased or absent breath sounds. Musculoskeletal: Full range of motion to all extremities. No gross deformities appreciated. Neurologic:  Normal speech and language. No gross focal neurologic deficits are appreciated.  Skin:  Skin is warm, dry and intact. No rash noted.  Regular laceration noted to the fourth digit of the right hand.  Missing epidermal tissue and chunks along laceration.  No bleeding.  No foreign body.  Total length laceration is approximately 3 cm in length. Psychiatric: Mood and affect are normal. Speech and behavior are normal. Patient exhibits appropriate insight and judgement.   ____________________________________________   LABS (all labs ordered are listed, but only  abnormal results are displayed)  Labs Reviewed - No data to display ____________________________________________  EKG   ____________________________________________  RADIOLOGY   No results found.  ____________________________________________    PROCEDURES  Procedure(s) performed:    Marland KitchenMarland KitchenLaceration Repair Date/Time: 03/24/2018 10:55 PM Performed by: Racheal Patches, PA-C Authorized by: Racheal Patches, PA-C   Consent:    Consent obtained:  Verbal   Consent given by:  Patient   Risks discussed:  Infection and pain Anesthesia (see MAR for exact dosages):    Anesthesia method:  None Laceration details:    Location:  Finger   Finger location:  R index finger   Length (cm):  3 Repair type:    Repair type:  Simple Exploration:    Hemostasis achieved with:  Direct  pressure   Wound exploration: wound explored through full range of motion and entire depth of wound probed and visualized     Wound extent: no foreign bodies/material noted, no muscle damage noted, no nerve damage noted, no tendon damage noted, no underlying fracture noted and no vascular damage noted   Treatment:    Area cleansed with:  Betadine   Amount of cleaning:  Extensive   Irrigation solution:  Sterile saline   Irrigation volume:  1000 ml   Irrigation method:  Syringe Approximation:    Approximation:  Loose Post-procedure details:    Dressing:  Non-adherent dressing, tube gauze and splint for protection   Patient tolerance of procedure:  Tolerated well, no immediate complications Comments:     Patient had regular laceration noted to the index finger of the right hand.  Small sections of epidermal tissue was missing.  Edges would not be well approximated.  After lengthy discussion with patient to include seizures, adhesive, packing with Surgicel and splint for protection, it was decided that Surgicel option would be best.  Area sterilely cleansed, dressed, splint for protection.  Wound care  instructions provided to patient.      Medications  traMADol (ULTRAM) tablet 50 mg (50 mg Oral Given 03/24/18 2138)     ____________________________________________   INITIAL IMPRESSION / ASSESSMENT AND PLAN / ED COURSE  Pertinent labs & imaging results that were available during my care of the patient were reviewed by me and considered in my medical decision making (see chart for details).  Review of the [place] CSRS was performed in accordance of the [facility] prior to dispensing any controlled drugs.     Patient's diagnosis is consistent with laceration to the second digit of the right hand.  Wound is treated and cleansed as described above.  Wound care instructions provided to patient.  Patient will be placed on antibiotics prophylactically.  Tetanus shot was up-to-date.  Patient will follow-up with primary care as needed.. Patient is given ED precautions to return to the ED for any worsening or new symptoms.     ____________________________________________  FINAL CLINICAL IMPRESSION(S) / ED DIAGNOSES  Final diagnoses:  Laceration of right ring finger without foreign body without damage to nail, initial encounter      NEW MEDICATIONS STARTED DURING THIS VISIT:  ED Discharge Orders        Ordered    traMADol (ULTRAM) 50 MG tablet  Every 6 hours PRN     03/24/18 2133    cephALEXin (KEFLEX) 500 MG capsule  4 times daily     03/24/18 2133          This chart was dictated using voice recognition software/Dragon. Despite best efforts to proofread, errors can occur which can change the meaning. Any change was purely unintentional.    Racheal Patches, PA-C 03/24/18 2258    Minna Antis, MD 03/25/18 0010

## 2018-03-25 ENCOUNTER — Ambulatory Visit: Payer: 59 | Admitting: Family Medicine

## 2018-03-27 ENCOUNTER — Other Ambulatory Visit: Payer: Self-pay

## 2018-03-27 ENCOUNTER — Encounter: Payer: Self-pay | Admitting: Family Medicine

## 2018-03-27 ENCOUNTER — Ambulatory Visit: Payer: 59 | Admitting: Family Medicine

## 2018-03-27 VITALS — BP 120/74 | HR 91 | Temp 98.6°F | Ht 62.5 in | Wt 170.8 lb

## 2018-03-27 DIAGNOSIS — R945 Abnormal results of liver function studies: Secondary | ICD-10-CM

## 2018-03-27 DIAGNOSIS — E1165 Type 2 diabetes mellitus with hyperglycemia: Secondary | ICD-10-CM

## 2018-03-27 DIAGNOSIS — E669 Obesity, unspecified: Secondary | ICD-10-CM | POA: Diagnosis not present

## 2018-03-27 DIAGNOSIS — M5010 Cervical disc disorder with radiculopathy, unspecified cervical region: Secondary | ICD-10-CM | POA: Diagnosis not present

## 2018-03-27 DIAGNOSIS — R7989 Other specified abnormal findings of blood chemistry: Secondary | ICD-10-CM

## 2018-03-27 LAB — HM DIABETES FOOT EXAM

## 2018-03-27 MED ORDER — ATORVASTATIN CALCIUM 80 MG PO TABS: 80.0000 mg | ORAL_TABLET | Freq: Every day | ORAL | 2 refills | Status: DC

## 2018-03-27 MED ORDER — PANTOPRAZOLE SODIUM 40 MG PO TBEC: 40.0000 mg | DELAYED_RELEASE_TABLET | Freq: Every day | ORAL | 3 refills | Status: DC

## 2018-03-27 MED ORDER — TRAMADOL HCL 50 MG PO TABS: 50.0000 mg | ORAL_TABLET | Freq: Four times a day (QID) | ORAL | 0 refills | Status: AC | PRN

## 2018-03-27 MED ORDER — METFORMIN HCL ER 500 MG PO TB24: ORAL_TABLET | ORAL | 1 refills | Status: DC

## 2018-03-27 MED ORDER — ESCITALOPRAM OXALATE 20 MG PO TABS: 20.0000 mg | ORAL_TABLET | Freq: Every day | ORAL | 1 refills | Status: DC

## 2018-03-27 NOTE — Assessment & Plan Note (Signed)
IMproving control.. Increase metfromrin and work on diet and lifestyle aggressively.

## 2018-03-27 NOTE — Assessment & Plan Note (Signed)
Return to GI  For follow up. May be worse given off statin.

## 2018-03-27 NOTE — Progress Notes (Signed)
Subjective:    Patient ID: Karla Peterson, female    DOB: Jul 17, 1956, 62 y.o.   MRN: [id number]  HPI  62 year old female presents for DM follow up.  Diabetes:  Gradual improvement on metformin  3 tabs daily Lab Results  Component Value Date   HGBA1C 7.8 (H) 03/21/2018  Using medications without difficulties: Hypoglycemic episodes:? Hyperglycemic episodes:? Feet problems: none Blood Sugars averaging: not checking lately. eye exam within last year: yes  Has stable fatty liver.Marland Kitchen LFTs slightly increased. Plans to return to GI for re-eval. No ETOH use. No tylenol use.  Fatty liver  On [place] in past. Nml hepatitis panel in 2012. Father with cirrhosis from ETOH and possible hepatitis.   Elevated Cholesterol:  Very poor control compared to last check  6 months ago.Marland Kitchen Has been out in last 3 months lipitor 80 mg daily Lab Results  Component Value Date   CHOL 310 (H) 03/21/2018   HDL 42.30 03/21/2018   LDLCALC 103 (H) 11/05/2016   LDLDIRECT 217.0 03/21/2018   TRIG 303.0 (H) 03/21/2018   CHOLHDL 7 03/21/2018  Using medications without problems: Muscle aches:  Diet compliance: Exercise: Other complaints:     Right hand injury: Seen in ER on 03/25/2018  Laceration right ring finger. No sutures.. Treated with surgicel, wearing brace.  Treated with cephalexin prophylactically and with tramadol for pain. Using every 4 hours. She is still having throbbing pina in finger, tramadol helps her sleep some at night.  Blood pressure 120/74, pulse 91, temperature 98.6 F (37 C), temperature source Oral, height 5' 2.5" (1.588 m), weight 170 lb 12 oz (77.5 kg).  Review of Systems  Constitutional: Negative for fatigue and fever.  HENT: Negative for congestion.   Eyes: Negative for pain.  Respiratory: Negative for cough and shortness of breath.   Cardiovascular: Negative for chest pain, palpitations and leg swelling.  Gastrointestinal: Negative for abdominal pain.  Genitourinary: Negative  for dysuria and vaginal bleeding.  Musculoskeletal: Negative for back pain.  Neurological: Negative for syncope, light-headedness and headaches.  Psychiatric/Behavioral: Negative for dysphoric mood.       Objective:   Physical Exam  Constitutional: Vital signs are normal. She appears well-developed and well-nourished. She is cooperative.  Non-toxic appearance. She does not appear ill. No distress.  HENT:  Head: Normocephalic.  Right Ear: Hearing, tympanic membrane, external ear and ear canal normal. Tympanic membrane is not erythematous, not retracted and not bulging.  Left Ear: Hearing, tympanic membrane, external ear and ear canal normal. Tympanic membrane is not erythematous, not retracted and not bulging.  Nose: No mucosal edema or rhinorrhea. Right sinus exhibits no maxillary sinus tenderness and no frontal sinus tenderness. Left sinus exhibits no maxillary sinus tenderness and no frontal sinus tenderness.  Mouth/Throat: Uvula is midline, oropharynx is clear and moist and mucous membranes are normal.  Eyes: Pupils are equal, round, and reactive to light. Conjunctivae, EOM and lids are normal. Lids are everted and swept, no foreign bodies found.  Neck: Trachea normal and normal range of motion. Neck supple. Carotid bruit is not present. No thyroid mass and no thyromegaly present.  Cardiovascular: Normal rate, regular rhythm, S1 normal, S2 normal, normal heart sounds, intact distal pulses and normal pulses. Exam reveals no gallop and no friction rub.  No murmur heard. Pulmonary/Chest: Effort normal and breath sounds normal. No tachypnea. No respiratory distress. She has no decreased breath sounds. She has no wheezes. She has no rhonchi. She has no rales.  Abdominal: Soft.  Normal appearance and bowel sounds are normal. There is no tenderness.  Neurological: She is alert.  Skin: Skin is warm, dry and intact. No rash noted.  Psychiatric: Her speech is normal and behavior is normal. Judgment  and thought content normal. Her mood appears not anxious. Cognition and memory are normal. She does not exhibit a depressed mood.    right finger 4th digit.. Some of wrap removed.. No erythema or swelling, nml sensation.  Diabetic foot exam: Normal inspection No skin breakdown No calluses  Normal DP pulses Normal sensation to light touch and monofilament Nails normal       Assessment & Plan:

## 2018-03-27 NOTE — Patient Instructions (Addendum)
Call to schedule appt with GI Dr. Rhea Belton to discuss fatty liver.  Restart lipitor 80 mg hdaily.  Increase metformin to 4  Tablets daily.  Work on wiegt loss, exercise and low carb diet.  Remove splint 1 week after injury but do not remove underlying material aggressively.  Can follow up as needed.  Call if fever, redness ETc.

## 2018-03-27 NOTE — Assessment & Plan Note (Signed)
Pain improved 

## 2018-07-16 DIAGNOSIS — M5416 Radiculopathy, lumbar region: Secondary | ICD-10-CM | POA: Diagnosis not present

## 2018-07-16 DIAGNOSIS — M5136 Other intervertebral disc degeneration, lumbar region: Secondary | ICD-10-CM | POA: Diagnosis not present

## 2018-07-16 DIAGNOSIS — M7062 Trochanteric bursitis, left hip: Secondary | ICD-10-CM | POA: Diagnosis not present

## 2018-10-07 ENCOUNTER — Other Ambulatory Visit: Payer: Self-pay | Admitting: Family Medicine

## 2018-10-08 ENCOUNTER — Telehealth: Payer: Self-pay | Admitting: Family Medicine

## 2018-10-08 NOTE — Telephone Encounter (Signed)
Left message asking pt to call office  °

## 2018-10-08 NOTE — Telephone Encounter (Signed)
Last office visit 03/27/2018 for Poorly Controlled DM.  AVS states to follow up in 3 months.  No future appointments.  Refill?

## 2018-10-08 NOTE — Telephone Encounter (Signed)
Robin, please schedule 3 month DM follow up with fasting labs prior.  Must schedule appointment before his medication can be refilled.  Please send back to Lupita LeashDonna once appointment is made for her to refill. Thanks!

## 2018-10-08 NOTE — Telephone Encounter (Signed)
Last office visit 03/27/2018 for Poorly Controlled DM.  AVS states to follow up in 3 months.  No future appointments.  Refill? °

## 2018-10-08 NOTE — Telephone Encounter (Signed)
Robin, please schedule 3 month DM follow up with fasting labs prior.  Must schedule appointment before his medication can be refilled.  Please send back to Donna once appointment is made for her to refill. Thanks! ° °

## 2018-10-08 NOTE — Telephone Encounter (Signed)
°  Left message asking pt to call office     Robin, please schedule 3 month DM follow up with fasting labs prior. Must schedule appointment before his medication can be refilled. Please send back to Lupita LeashDonna once appointment is made for her to refill. Thanks!    last office visit 03/27/2018 for Poorly Controlled DM.  AVS states to follow up in 3 months.  No future appointments.  Refill?

## 2018-10-09 NOTE — Telephone Encounter (Signed)
Labs 12/9 Follow up with dr Ermalene Searingbedsole 12/13 Pt aware

## 2018-11-03 ENCOUNTER — Other Ambulatory Visit (INDEPENDENT_AMBULATORY_CARE_PROVIDER_SITE_OTHER): Payer: 59

## 2018-11-03 DIAGNOSIS — E1165 Type 2 diabetes mellitus with hyperglycemia: Secondary | ICD-10-CM

## 2018-11-03 LAB — COMPREHENSIVE METABOLIC PANEL
ALK PHOS: 127 U/L — AB (ref 39–117)
ALT: 77 U/L — ABNORMAL HIGH (ref 0–35)
AST: 41 U/L — AB (ref 0–37)
Albumin: 4.4 g/dL (ref 3.5–5.2)
BUN: 15 mg/dL (ref 6–23)
CO2: 31 mEq/L (ref 19–32)
Calcium: 9 mg/dL (ref 8.4–10.5)
Chloride: 101 mEq/L (ref 96–112)
Creatinine, Ser: 0.56 mg/dL (ref 0.40–1.20)
GFR: 116 mL/min (ref >60.00)
GLUCOSE: 133 mg/dL — AB (ref 70–99)
POTASSIUM: 4.2 meq/L (ref 3.5–5.1)
SODIUM: 139 meq/L (ref 135–145)
Total Bilirubin: 0.6 mg/dL (ref 0.2–1.2)
Total Protein: 7.6 g/dL (ref 6.0–8.3)

## 2018-11-03 LAB — HEMOGLOBIN A1C: Hgb A1c MFr Bld: 7.9 % — ABNORMAL HIGH (ref 4.6–6.5)

## 2018-11-07 ENCOUNTER — Encounter: Payer: Self-pay | Admitting: Family Medicine

## 2018-11-07 ENCOUNTER — Ambulatory Visit: Payer: 59 | Admitting: Family Medicine

## 2018-11-07 VITALS — BP 158/92 | HR 79 | Temp 98.7°F | Ht 62.5 in | Wt 174.5 lb

## 2018-11-07 DIAGNOSIS — M79604 Pain in right leg: Secondary | ICD-10-CM | POA: Insufficient documentation

## 2018-11-07 DIAGNOSIS — E1159 Type 2 diabetes mellitus with other circulatory complications: Secondary | ICD-10-CM

## 2018-11-07 DIAGNOSIS — E782 Mixed hyperlipidemia: Secondary | ICD-10-CM | POA: Diagnosis not present

## 2018-11-07 DIAGNOSIS — R609 Edema, unspecified: Secondary | ICD-10-CM | POA: Diagnosis not present

## 2018-11-07 DIAGNOSIS — I10 Essential (primary) hypertension: Secondary | ICD-10-CM

## 2018-11-07 DIAGNOSIS — E669 Obesity, unspecified: Secondary | ICD-10-CM

## 2018-11-07 DIAGNOSIS — Z23 Encounter for immunization: Secondary | ICD-10-CM

## 2018-11-07 DIAGNOSIS — E1165 Type 2 diabetes mellitus with hyperglycemia: Secondary | ICD-10-CM

## 2018-11-07 DIAGNOSIS — I152 Hypertension secondary to endocrine disorders: Secondary | ICD-10-CM | POA: Insufficient documentation

## 2018-11-07 DIAGNOSIS — M79605 Pain in left leg: Secondary | ICD-10-CM

## 2018-11-07 DIAGNOSIS — I700 Atherosclerosis of aorta: Secondary | ICD-10-CM

## 2018-11-07 LAB — CBC WITH DIFFERENTIAL/PLATELET
BASOS ABS: 0.1 10*3/uL (ref 0.0–0.1)
Basophils Relative: 0.7 % (ref 0.0–3.0)
EOS ABS: 0.2 10*3/uL (ref 0.0–0.7)
Eosinophils Relative: 2.2 % (ref 0.0–5.0)
HEMATOCRIT: 40.1 % (ref 36.0–46.0)
HEMOGLOBIN: 13.3 g/dL (ref 12.0–15.0)
LYMPHS PCT: 30.8 % (ref 12.0–46.0)
Lymphs Abs: 3.4 10*3/uL (ref 0.7–4.0)
MCHC: 33.1 g/dL (ref 30.0–36.0)
MCV: 88.2 fl (ref 78.0–100.0)
MONO ABS: 0.8 10*3/uL (ref 0.1–1.0)
Monocytes Relative: 7.4 % (ref 3.0–12.0)
Neutro Abs: 6.4 10*3/uL (ref 1.4–7.7)
Neutrophils Relative %: 58.9 % (ref 43.0–77.0)
Platelets: 355 10*3/uL (ref 150.0–400.0)
RBC: 4.55 Mil/uL (ref 3.87–5.11)
RDW: 14.5 % (ref 11.5–15.5)
WBC: 11 10*3/uL — AB (ref 4.0–10.5)

## 2018-11-07 LAB — LIPID PANEL
CHOLESTEROL: 247 mg/dL — AB (ref 0–200)
HDL: 54 mg/dL (ref >39.00)
LDL CALC: 153 mg/dL — AB (ref 0–99)
NonHDL: 192.94
TRIGLYCERIDES: 198 mg/dL — AB (ref 0.0–149.0)
Total CHOL/HDL Ratio: 5
VLDL: 39.6 mg/dL (ref 0.0–40.0)

## 2018-11-07 LAB — TSH: TSH: 4.95 u[IU]/mL — AB (ref 0.35–4.50)

## 2018-11-07 LAB — HM DIABETES FOOT EXAM

## 2018-11-07 MED ORDER — HYDROCHLOROTHIAZIDE 25 MG PO TABS: 25.0000 mg | ORAL_TABLET | Freq: Every day | ORAL | 11 refills | Status: AC

## 2018-11-07 NOTE — Assessment & Plan Note (Signed)
Encouraged exercise, weight loss, healthy eating habits. ? ?

## 2018-11-07 NOTE — Assessment & Plan Note (Signed)
Not adequately controlled at this time with metofmrin. Recommend SGLT2 inh or GLP1 as next step given known aortic atherosclerosis... given leg pain will not start at this time.Marland Kitchen. discuss at 1 month follow up.

## 2018-11-07 NOTE — Assessment & Plan Note (Signed)
Due for re-eval back on statin.Marland Kitchen. on max dose. If not at goal LDL < 100 will need to start zetia in addition.  Reviewed low chol diet.

## 2018-11-07 NOTE — Assessment & Plan Note (Addendum)
No clear association with statin.. was occurring when was off for 3 months. NO low back issues or sign of spinal stenosis/neurogenic claudication  Not associated with joint pain. Better with walking and nml pulses.. not likely  PAD.  Worse at end of day and with swelling and at rest... pain over varicosities.  Likely due to venous insufficiency and vein pain.  Start compression hose, elevation and start diuretic.

## 2018-11-07 NOTE — Assessment & Plan Note (Signed)
New dX. Eval with labs for secondary cause.  Start HCTZ. Follow at home and in 1 month.

## 2018-11-07 NOTE — Patient Instructions (Addendum)
Start compression hose when up on feet... 15-20 mmHG compression hose.  Elevate feet at end of day.  Start HCTZ fluid pill for hypertension.  Please stop at the lab to have labs drawn.

## 2018-11-07 NOTE — Progress Notes (Signed)
Subjective:    Patient ID: Karla Peterson, female    DOB: 09-24-1956, 62 y.o.   MRN: [id number]  HPI   62 year old female presents for follow up 3 month DM   Bilateral leg pain.Marland Kitchen ongoing for years but worsening. Did not go away off of statin.  Legs ache more when sitting down at end of the day.  Not worse with standing.. better  Or no issue with walking  History of vein stripping.  Feels better with warmth.  Diabetes:  Has not improved further on metformin max.  Hx of atherosclerosis and nml renal function. Lab Results  Component Value Date   HGBA1C 7.9 (H) 11/03/2018  Using medications without difficulties: Hypoglycemic episodes: Hyperglycemic episodes: Feet problems: no ulcers Blood Sugars averaging: eye exam within last year: due  Hypertension:    Using medication without problems or lightheadedness:  none Chest pain with exertion:none Edema: Has noted swelling in ankles, and  bilateral leg pain at end of day .. treats with heating pads. Short of breath:none Average home BPs: 170/90.Marland Kitchen possible inaccurate machine. Other issues: BP Readings from Last 3 Encounters:  11/07/18 (!) 158/92  03/27/18 120/74  03/24/18 (!) 146/68   Restarted statin in 03/2018.. but had leg pain prior to that. Lab Results  Component Value Date   CHOL 310 (H) 03/21/2018   HDL 42.30 03/21/2018   LDLCALC 103 (H) 11/05/2016   LDLDIRECT 217.0 03/21/2018   TRIG 303.0 (H) 03/21/2018   CHOLHDL 7 03/21/2018     Social History /Family History/Past Medical History reviewed in detail and updated in EMR if needed. Blood pressure (!) 158/92, pulse 79, temperature 98.7 F (37.1 C), temperature source Oral, height 5' 2.5" (1.588 m), weight 174 lb 8 oz (79.2 kg).  Review of Systems  Constitutional: Negative for fatigue and fever.  HENT: Negative for congestion.   Eyes: Negative for pain.  Respiratory: Negative for cough and shortness of breath.   Cardiovascular: Negative for chest pain,  palpitations and leg swelling.  Gastrointestinal: Negative for abdominal pain.  Genitourinary: Negative for dysuria and vaginal bleeding.  Musculoskeletal: Negative for arthralgias and back pain.  Neurological: Negative for syncope, light-headedness and headaches.  Psychiatric/Behavioral: Negative for dysphoric mood.       Objective:   Physical Exam Constitutional:      General: She is not in acute distress.    Appearance: Normal appearance. She is well-developed. She is not ill-appearing or toxic-appearing.     Comments:  Central obesity  HENT:     Head: Normocephalic.     Right Ear: Hearing, tympanic membrane, ear canal and external ear normal. Tympanic membrane is not erythematous, retracted or bulging.     Left Ear: Hearing, tympanic membrane, ear canal and external ear normal. Tympanic membrane is not erythematous, retracted or bulging.     Nose: No mucosal edema or rhinorrhea.     Right Sinus: No maxillary sinus tenderness or frontal sinus tenderness.     Left Sinus: No maxillary sinus tenderness or frontal sinus tenderness.     Mouth/Throat:     Pharynx: Uvula midline.  Eyes:     General: Lids are normal. Lids are everted, no foreign bodies appreciated.     Conjunctiva/sclera: Conjunctivae normal.     Pupils: Pupils are equal, round, and reactive to light.  Neck:     Musculoskeletal: Normal range of motion and neck supple.     Thyroid: No thyroid mass or thyromegaly.     Vascular: No  carotid bruit.     Trachea: Trachea normal.  Cardiovascular:     Rate and Rhythm: Normal rate and regular rhythm.     Pulses: Normal pulses.     Heart sounds: Normal heart sounds, S1 normal and S2 normal. No murmur. No friction rub. No gallop.   Pulmonary:     Effort: Pulmonary effort is normal. No tachypnea or respiratory distress.     Breath sounds: Normal breath sounds. No decreased breath sounds, wheezing, rhonchi or rales.  Chest:     Comments:  Bilateral 1 plus edema, pitting and  bilateral varicose veins, pt points to area of ttp  On right primarily over a web of varicosities.. no nodules Abdominal:     General: Bowel sounds are normal.     Palpations: Abdomen is soft.     Tenderness: There is no abdominal tenderness.  Musculoskeletal:     Right knee: Normal.     Left knee: Normal.     Right ankle: She exhibits swelling.     Left ankle: She exhibits swelling.     Lumbar back: Normal. She exhibits normal range of motion, no tenderness and no bony tenderness.     Comments: Focal lateral swelling behind lateral malleolus in bilateral ankles  Skin:    General: Skin is warm and dry.     Findings: No rash.  Neurological:     Mental Status: She is alert.  Psychiatric:        Mood and Affect: Mood is not anxious or depressed.        Speech: Speech normal.        Behavior: Behavior normal. Behavior is cooperative.        Thought Content: Thought content normal.        Judgment: Judgment normal.    Diabetic foot exam: Normal inspection No skin breakdown No calluses  Normal DP pulses Normal sensation to light touch and monofilament Nails normal        Assessment & Plan:

## 2018-11-13 ENCOUNTER — Encounter: Payer: Self-pay | Admitting: Family Medicine

## 2018-11-13 ENCOUNTER — Ambulatory Visit: Payer: 59 | Admitting: Family Medicine

## 2018-11-13 VITALS — BP 150/88 | HR 104 | Temp 99.8°F | Ht 62.5 in | Wt 170.8 lb

## 2018-11-13 DIAGNOSIS — B9789 Other viral agents as the cause of diseases classified elsewhere: Secondary | ICD-10-CM | POA: Diagnosis not present

## 2018-11-13 DIAGNOSIS — I10 Essential (primary) hypertension: Secondary | ICD-10-CM | POA: Diagnosis not present

## 2018-11-13 DIAGNOSIS — E1159 Type 2 diabetes mellitus with other circulatory complications: Secondary | ICD-10-CM

## 2018-11-13 DIAGNOSIS — J069 Acute upper respiratory infection, unspecified: Secondary | ICD-10-CM

## 2018-11-13 DIAGNOSIS — I152 Hypertension secondary to endocrine disorders: Secondary | ICD-10-CM

## 2018-11-13 NOTE — Patient Instructions (Signed)
Based on your symptoms, it looks like you have a virus.   Antibiotics are not need for a viral infection but the following will help:   1. Drink plenty of fluids 2. Get lots of rest  Sinus Congestion 1) Neti Pot (Saline rinse) -- 2 times day -- if tolerated 2) Flonase (Store Brand ok) - once daily 3) Over the counter congestion medications  Cough 1) Cough drops can be helpful 2) Nyquil (or nighttime cough medication) 3) Honey is proven to be one of the best cough medications    If you develop fevers (Temperature >100.4), chills, worsening symptoms or symptoms lasting longer than 10 days return to clinic.

## 2018-11-13 NOTE — Progress Notes (Signed)
Subjective:     Karla Peterson is a 62 y.o. female presenting for Nasal Congestion (x 3 days. Post nasal drainage, runny nose, headaches, sneezing, cough a lot, chest tightness, body aches. No fever. Has taking OTC cold and pain medication-not sure of the name. )     Sinus Problem  This is a new problem. The current episode started in the past 7 days. The problem has been gradually worsening since onset. There has been no fever. Associated symptoms include congestion, coughing, headaches, sinus pressure, sneezing and a sore throat. Pertinent negatives include no chills, diaphoresis, ear pain, neck pain or shortness of breath. Past treatments include oral decongestants. The treatment provided mild relief.   Someone at work was sick  #HTN - just started HCTZ - taking medication - follow-up in 3 weeks  Review of Systems  Constitutional: Negative for chills, diaphoresis and fever.  HENT: Positive for congestion, postnasal drip, rhinorrhea, sinus pressure, sneezing and sore throat. Negative for ear pain.   Respiratory: Positive for cough and chest tightness. Negative for shortness of breath.   Gastrointestinal: Negative for nausea and vomiting.  Musculoskeletal: Positive for myalgias. Negative for neck pain.  Neurological: Positive for headaches.     Social History   Tobacco Use  Smoking Status Never Smoker  Smokeless Tobacco Never Used        Objective:    BP Readings from Last 3 Encounters:  11/13/18 (!) 150/88  11/07/18 (!) 158/92  03/27/18 120/74   Wt Readings from Last 3 Encounters:  11/13/18 170 lb 12 oz (77.5 kg)  11/07/18 174 lb 8 oz (79.2 kg)  03/27/18 170 lb 12 oz (77.5 kg)    BP (!) 150/88   Pulse (!) 104   Temp 99.8 F (37.7 C)   Ht 5' 2.5" (1.588 m)   Wt 170 lb 12 oz (77.5 kg)   SpO2 96%   BMI 30.73 kg/m    Physical Exam Constitutional:      General: She is not in acute distress.    Appearance: She is well-developed. She is not diaphoretic.   HENT:     Head: Normocephalic and atraumatic.     Right Ear: Tympanic membrane and ear canal normal.     Left Ear: Tympanic membrane and ear canal normal.     Nose: Mucosal edema and rhinorrhea present.     Right Sinus: No maxillary sinus tenderness or frontal sinus tenderness.     Left Sinus: No maxillary sinus tenderness or frontal sinus tenderness.     Mouth/Throat:     Pharynx: Uvula midline. Posterior oropharyngeal erythema present. No oropharyngeal exudate.     Tonsils: Swelling: 0 on the right. 0 on the left.  Eyes:     General: No scleral icterus.    Conjunctiva/sclera: Conjunctivae normal.  Neck:     Musculoskeletal: Neck supple.  Cardiovascular:     Rate and Rhythm: Normal rate and regular rhythm.     Heart sounds: Normal heart sounds. No murmur.  Pulmonary:     Effort: Pulmonary effort is normal. No respiratory distress.     Breath sounds: Rhonchi (faint, scattered) present.  Lymphadenopathy:     Cervical: No cervical adenopathy.  Skin:    General: Skin is warm and dry.     Capillary Refill: Capillary refill takes less than 2 seconds.  Neurological:     Mental Status: She is alert.           Assessment & Plan:   Problem  List Items Addressed This Visit      Cardiovascular and Mediastinum   Hypertension associated with diabetes (HCC)    BP mildly elevated, but improved from prior since starting medication. Cont med and keep follow-up appointment       Other Visit Diagnoses    Viral URI with cough    -  Primary     Overall well appearing and symptoms x 3 days. Suspect viral etiology w/o fevers.   Symptomatic care Return if worsening or not improving over 7-10 days  Return if symptoms worsen or fail to improve.  Lynnda ChildJessica R Bennie Chirico, MD

## 2018-11-13 NOTE — Assessment & Plan Note (Signed)
BP mildly elevated, but improved from prior since starting medication. Cont med and keep follow-up appointment

## 2018-11-17 ENCOUNTER — Telehealth: Payer: Self-pay | Admitting: *Deleted

## 2018-11-17 NOTE — Telephone Encounter (Signed)
Spoke to pt who states she was seen two separate times within a week for her upper respiratory infection. She states she has had very little improvement and states her cough has worsened. Pt is requesting a cough med be sent to her pharmacy that she can take both night and day, even if there are two rx. [facility] pls advise

## 2018-11-18 MED ORDER — BENZONATATE 200 MG PO CAPS: 200.0000 mg | ORAL_CAPSULE | Freq: Two times a day (BID) | ORAL | 0 refills | Status: DC | PRN

## 2018-11-18 MED ORDER — GUAIFENESIN-CODEINE 100-10 MG/5ML PO SYRP: 5.0000 mL | ORAL_SOLUTION | Freq: Every evening | ORAL | 0 refills | Status: DC | PRN

## 2018-11-18 NOTE — Telephone Encounter (Signed)
Spoke to pt

## 2018-11-18 NOTE — Telephone Encounter (Signed)
Call  I sent in benzonatate to take during the day and cough suppressant to use at night. Expect 7-10 days of illness, if new fever late in illness or shortness of breath.. pt needs to be seen earlier.

## 2018-12-02 ENCOUNTER — Ambulatory Visit: Payer: 59 | Admitting: Family Medicine

## 2018-12-04 ENCOUNTER — Encounter: Payer: Self-pay | Admitting: Family Medicine

## 2018-12-04 ENCOUNTER — Ambulatory Visit: Payer: 59 | Admitting: Family Medicine

## 2018-12-04 ENCOUNTER — Ambulatory Visit (INDEPENDENT_AMBULATORY_CARE_PROVIDER_SITE_OTHER)
Admission: RE | Admit: 2018-12-04 | Discharge: 2018-12-04 | Disposition: A | Discharge status: Home / Self Care | Payer: 59 | Source: Ambulatory Visit | Attending: Family Medicine | Admitting: Family Medicine

## 2018-12-04 VITALS — BP 130/80 | HR 82 | Temp 99.2°F | Ht 62.5 in | Wt 170.8 lb

## 2018-12-04 DIAGNOSIS — M545 Low back pain, unspecified: Secondary | ICD-10-CM

## 2018-12-04 DIAGNOSIS — M25561 Pain in right knee: Secondary | ICD-10-CM

## 2018-12-04 DIAGNOSIS — W19XXXA Unspecified fall, initial encounter: Secondary | ICD-10-CM | POA: Diagnosis not present

## 2018-12-04 MED ORDER — DICLOFENAC SODIUM 75 MG PO TBEC: 75.0000 mg | DELAYED_RELEASE_TABLET | Freq: Two times a day (BID) | ORAL | 0 refills | Status: DC

## 2018-12-04 MED ORDER — HYDROCODONE-ACETAMINOPHEN 5-325 MG PO TABS
1.0000 | ORAL_TABLET | Freq: Four times a day (QID) | ORAL | 0 refills | Status: AC | PRN
Start: 2018-12-04 — End: 2018-12-09

## 2018-12-04 MED ORDER — CYCLOBENZAPRINE HCL 10 MG PO TABS: 10.0000 mg | ORAL_TABLET | Freq: Every evening | ORAL | 0 refills | Status: DC | PRN

## 2018-12-04 NOTE — Patient Instructions (Signed)
Heat on low back.  Start home exercises.  Diclofenac twice daily for pain and inflammation.  Muscle relaxant as needed at night for muscle spasm.  Can use hydrocodone/apap for breakthrough pain.   For knee: ice and start home stretching.  Wear brace when up on feet. Elevate as able.  We will call with X-ray results.  If not improving in 2 weeks follow up.

## 2018-12-04 NOTE — Progress Notes (Signed)
Subjective:    Patient ID: Karla Peterson, female    DOB: [birth year], 63 y.o.   MRN: [id number]  HPI  63 year old obese female presents following fall after slip on ice on deck yesterday.  Landed on buttocks.  She reports bilateral pain in her low back. No radiation of pain into legs. No numbness or weakness . Cannot get comfortable, pain in low back when taking deep breaths of cough.  She has taken some old tramadol.. did not help.  She has applied heat, ice on knee.   She has also had some pain in right knee... pain with flexion and extension and standing on knee.  Hx of knee issues from OA ( steroid injection)   Seen 2 weeks ago for viral URI by Dr. Selena Battenody.  She reports continued cough.. but other symptoms better. No SOB, no fever.  She requests cough suppressant.   Hx of chronic low back [pain and cervical neck pain.  Blood pressure 130/80, pulse 82, temperature 99.2 F (37.3 C), temperature source Oral, height 5' 2.5" (1.588 m), weight 170 lb 12 oz (77.5 kg). Social History /Family History/Past Medical History reviewed in detail and updated in EMR if needed.  Review of Systems  Constitutional: Negative for fatigue and fever.  HENT: Negative for congestion.   Eyes: Negative for pain.  Respiratory: Negative for cough and shortness of breath.   Cardiovascular: Negative for chest pain, palpitations and leg swelling.  Gastrointestinal: Negative for abdominal pain.  Genitourinary: Negative for dysuria and vaginal bleeding.  Musculoskeletal: Positive for back pain.  Neurological: Negative for syncope, light-headedness and headaches.  Psychiatric/Behavioral: Negative for dysphoric mood.       Objective:   Physical Exam Constitutional:      General: She is not in acute distress.    Appearance: Normal appearance. She is well-developed. She is not ill-appearing or toxic-appearing.  HENT:     Head: Normocephalic.     Right Ear: Hearing, tympanic membrane, ear canal and  external ear normal. Tympanic membrane is not erythematous, retracted or bulging.     Left Ear: Hearing, tympanic membrane, ear canal and external ear normal. Tympanic membrane is not erythematous, retracted or bulging.     Nose: No mucosal edema or rhinorrhea.     Right Sinus: No maxillary sinus tenderness or frontal sinus tenderness.     Left Sinus: No maxillary sinus tenderness or frontal sinus tenderness.     Mouth/Throat:     Pharynx: Uvula midline.  Eyes:     General: Lids are normal. Lids are everted, no foreign bodies appreciated.     Conjunctiva/sclera: Conjunctivae normal.     Pupils: Pupils are equal, round, and reactive to light.  Neck:     Musculoskeletal: Normal range of motion and neck supple.     Thyroid: No thyroid mass or thyromegaly.     Vascular: No carotid bruit.     Trachea: Trachea normal.  Cardiovascular:     Rate and Rhythm: Normal rate and regular rhythm.     Pulses: Normal pulses.     Heart sounds: Normal heart sounds, S1 normal and S2 normal. No murmur. No friction rub. No gallop.   Pulmonary:     Effort: Pulmonary effort is normal. No tachypnea or respiratory distress.     Breath sounds: Normal breath sounds. No decreased breath sounds, wheezing, rhonchi or rales.  Abdominal:     General: Bowel sounds are normal.     Palpations: Abdomen is soft.  Tenderness: There is no abdominal tenderness.  Musculoskeletal:     Lumbar back: She exhibits decreased range of motion and tenderness. She exhibits no bony tenderness.     Comments: Neg faber's, neg SLR bilaterally  Skin:    General: Skin is warm and dry.     Findings: No rash.  Neurological:     Mental Status: She is alert.     Sensory: Sensation is intact.     Motor: Motor function is intact.  Psychiatric:        Mood and Affect: Mood is not anxious or depressed.        Speech: Speech normal.        Behavior: Behavior normal. Behavior is cooperative.        Thought Content: Thought content normal.         Judgment: Judgment normal.           Assessment & Plan:

## 2018-12-09 ENCOUNTER — Encounter: Payer: Self-pay | Admitting: *Deleted

## 2018-12-09 ENCOUNTER — Telehealth: Payer: Self-pay

## 2018-12-09 NOTE — Telephone Encounter (Signed)
Agree.. write return to work with light duties note .Marland Kitchen no lifting > 10lbs or repetitive bending for 2 weeks.

## 2018-12-09 NOTE — Telephone Encounter (Signed)
Pt was seen on 12/04/18 for back pain and is supposed to return to work on 12/10/18 at 12 noon. Pt request letter to return to work with light duty so pt will not have to pick up anything weighing more than 10 lbs. Pt said back is doing good but when tried to pick up her dog that weighed 18 lbs it bothered it back a little bit. Pt request cb when letter ready for pick up.

## 2018-12-09 NOTE — Telephone Encounter (Signed)
Work note written as instructed by Dr. Ermalene Searing.  Karla Peterson notified by telephone that note is ready to be picked up at the front desk Wednesday morning.

## 2018-12-29 ENCOUNTER — Other Ambulatory Visit: Payer: Self-pay | Admitting: Family Medicine

## 2018-12-29 NOTE — Telephone Encounter (Signed)
Last office visit 12/04/2018 for Fall.   Last refilled 12/04/2018 for #15 with no refills.  No future refills.   Ok to refill?

## 2019-01-02 ENCOUNTER — Encounter: Payer: Self-pay | Admitting: Family Medicine

## 2019-01-02 NOTE — Assessment & Plan Note (Signed)
Heat on low back.  Start home exercises.  Diclofenac twice daily for pain and inflammation.  Muscle relaxant as needed at night for muscle spasm.  Can use hydrocodone/apap for breakthrough pain.

## 2019-01-02 NOTE — Assessment & Plan Note (Signed)
Ice and start home stretching.  Wear brace when up on feet. Elevate as able.

## 2019-01-06 ENCOUNTER — Telehealth: Payer: Self-pay | Admitting: Family Medicine

## 2019-01-06 ENCOUNTER — Other Ambulatory Visit: Payer: Self-pay | Admitting: Family Medicine

## 2019-01-06 DIAGNOSIS — G8929 Other chronic pain: Secondary | ICD-10-CM

## 2019-01-06 DIAGNOSIS — M545 Low back pain: Secondary | ICD-10-CM

## 2019-01-06 DIAGNOSIS — S32020A Wedge compression fracture of second lumbar vertebra, initial encounter for closed fracture: Secondary | ICD-10-CM

## 2019-01-06 DIAGNOSIS — M5010 Cervical disc disorder with radiculopathy, unspecified cervical region: Secondary | ICD-10-CM

## 2019-01-06 NOTE — Telephone Encounter (Signed)
Patient notified as instructed by telephone and verbalized understanding. Advised patient that  our referral coordinator (Mariion) will be faxing over the referral and office notes to Dr. [place] office as instructed. Terri our x-ray tech will have her disk ready for pickup at the front office.  Patient stated that she will contact [facility] radiology department to get the other items that she needs to take with her for the appointment.

## 2019-01-06 NOTE — Telephone Encounter (Signed)
I have made consult for the patient, given Dr. B is out of town.  We will send all records.  She will need to get a copy of our films.  Would recommend that she get her recent lumbar spine film along with prior c-spine films and her prior c-spine MRI.  - Terri can burn her a disk from our office and she can pick up.

## 2019-01-06 NOTE — Progress Notes (Unsigned)
Consulting Dr. Cain Sieve in [place].  I am covering for Dr. Kerby Nora who is away from the office today.

## 2019-01-06 NOTE — Telephone Encounter (Signed)
Patient said she fractured back in January. Patient saw Dr.Bedsole.  Patient would like to be referred to [facility] in [place]. Patient scheduled appointment with Dr.Tim Cain Sieve on 01/12/19 at 11:00.  Patient said when she was here to see Dr.Bedsole she was going to refer patient to a neurosurgeon, but patient said she'd make her own appointment since she was already seeing a neurosurgeon.  Dr.Adamson's office sent her a booklet stating they need a referral,records and disc of imaging.  Dr.Adamson's phone number is [phone].

## 2019-01-12 DIAGNOSIS — S32020A Wedge compression fracture of second lumbar vertebra, initial encounter for closed fracture: Secondary | ICD-10-CM | POA: Diagnosis not present

## 2019-01-28 ENCOUNTER — Other Ambulatory Visit: Payer: Self-pay | Admitting: Family Medicine

## 2019-04-26 ENCOUNTER — Other Ambulatory Visit: Payer: Self-pay | Admitting: Family Medicine

## 2019-04-27 NOTE — Telephone Encounter (Signed)
Please schedule CPE with fasting labs or at least a follow up diabetes with fasting lab appointment with Dr. Ermalene Searing.

## 2019-04-28 NOTE — Telephone Encounter (Signed)
Left message asking pt to call office  Dr Ermalene Searing next cpx is 8/4 as of 6/2 @ 3:20

## 2019-04-28 NOTE — Telephone Encounter (Signed)
Labs 8/7 cpx 8/11 Pt aware

## 2019-06-10 LAB — HM DIABETES EYE EXAM

## 2019-07-02 ENCOUNTER — Telehealth: Payer: Self-pay | Admitting: Family Medicine

## 2019-07-02 DIAGNOSIS — E559 Vitamin D deficiency, unspecified: Secondary | ICD-10-CM

## 2019-07-02 DIAGNOSIS — E1165 Type 2 diabetes mellitus with hyperglycemia: Secondary | ICD-10-CM

## 2019-07-02 NOTE — Telephone Encounter (Signed)
-----   Message from Cloyd Stagers, RT sent at 06/24/2019  2:34 PM EDT ----- Regarding: Lab Orders for Friday 8.7.2020 Please place lab orders for Friday 8.7.2020, office visit for physical on Tuesday 8.11.2020 Thank you, Dyke Maes RT(R)

## 2019-07-03 ENCOUNTER — Other Ambulatory Visit (INDEPENDENT_AMBULATORY_CARE_PROVIDER_SITE_OTHER): Payer: 59

## 2019-07-03 ENCOUNTER — Other Ambulatory Visit: Payer: Self-pay

## 2019-07-03 DIAGNOSIS — E559 Vitamin D deficiency, unspecified: Secondary | ICD-10-CM | POA: Diagnosis not present

## 2019-07-03 DIAGNOSIS — E1165 Type 2 diabetes mellitus with hyperglycemia: Secondary | ICD-10-CM | POA: Diagnosis not present

## 2019-07-03 LAB — LIPID PANEL
Cholesterol: 292 mg/dL — ABNORMAL HIGH (ref 0–200)
HDL: 37.5 mg/dL — ABNORMAL LOW (ref >39.00)
NonHDL: 254.8
Total CHOL/HDL Ratio: 8
Triglycerides: 348 mg/dL — ABNORMAL HIGH (ref 0.0–149.0)
VLDL: 69.6 mg/dL — ABNORMAL HIGH (ref 0.0–40.0)

## 2019-07-03 LAB — COMPREHENSIVE METABOLIC PANEL
ALT: 98 U/L — ABNORMAL HIGH (ref 0–35)
AST: 62 U/L — ABNORMAL HIGH (ref 0–37)
Albumin: 4.3 g/dL (ref 3.5–5.2)
Alkaline Phosphatase: 113 U/L (ref 39–117)
BUN: 12 mg/dL (ref 6–23)
CO2: 30 mEq/L (ref 19–32)
Calcium: 9.1 mg/dL (ref 8.4–10.5)
Chloride: 99 mEq/L (ref 96–112)
Creatinine, Ser: 0.57 mg/dL (ref 0.40–1.20)
GFR: 106.71 mL/min (ref >60.00)
Glucose, Bld: 161 mg/dL — ABNORMAL HIGH (ref 70–99)
Potassium: 4.4 mEq/L (ref 3.5–5.1)
Sodium: 137 mEq/L (ref 135–145)
Total Bilirubin: 0.4 mg/dL (ref 0.2–1.2)
Total Protein: 7.3 g/dL (ref 6.0–8.3)

## 2019-07-03 LAB — LDL CHOLESTEROL, DIRECT: Direct LDL: 200 mg/dL

## 2019-07-03 LAB — MICROALBUMIN / CREATININE URINE RATIO
Creatinine,U: 41.4 mg/dL
Microalb Creat Ratio: 1.7 mg/g (ref 0.0–30.0)
Microalb, Ur: <0.7 mg/dL (ref 0.0–1.9)

## 2019-07-03 LAB — VITAMIN D 25 HYDROXY (VIT D DEFICIENCY, FRACTURES): VITD: 33.23 ng/mL (ref 30.00–100.00)

## 2019-07-03 LAB — HEMOGLOBIN A1C: Hgb A1c MFr Bld: 8.1 % — ABNORMAL HIGH (ref 4.6–6.5)

## 2019-07-07 ENCOUNTER — Other Ambulatory Visit: Payer: Self-pay

## 2019-07-07 ENCOUNTER — Encounter: Payer: Self-pay | Admitting: Family Medicine

## 2019-07-07 ENCOUNTER — Other Ambulatory Visit (HOSPITAL_COMMUNITY)
Admission: RE | Admit: 2019-07-07 | Discharge: 2019-07-07 | Disposition: A | Discharge status: Home / Self Care | Payer: 59 | Source: Ambulatory Visit | Attending: Family Medicine | Admitting: Family Medicine

## 2019-07-07 ENCOUNTER — Ambulatory Visit (INDEPENDENT_AMBULATORY_CARE_PROVIDER_SITE_OTHER): Payer: 59 | Admitting: Family Medicine

## 2019-07-07 VITALS — BP 140/80 | HR 79 | Temp 98.5°F | Ht 61.75 in | Wt 166.5 lb

## 2019-07-07 DIAGNOSIS — Z124 Encounter for screening for malignant neoplasm of cervix: Secondary | ICD-10-CM | POA: Diagnosis not present

## 2019-07-07 DIAGNOSIS — E782 Mixed hyperlipidemia: Secondary | ICD-10-CM | POA: Diagnosis not present

## 2019-07-07 DIAGNOSIS — I152 Hypertension secondary to endocrine disorders: Secondary | ICD-10-CM

## 2019-07-07 DIAGNOSIS — E1165 Type 2 diabetes mellitus with hyperglycemia: Secondary | ICD-10-CM

## 2019-07-07 DIAGNOSIS — E1159 Type 2 diabetes mellitus with other circulatory complications: Secondary | ICD-10-CM

## 2019-07-07 DIAGNOSIS — S32000S Wedge compression fracture of unspecified lumbar vertebra, sequela: Secondary | ICD-10-CM

## 2019-07-07 DIAGNOSIS — I10 Essential (primary) hypertension: Secondary | ICD-10-CM

## 2019-07-07 DIAGNOSIS — Z0000 Encounter for general adult medical examination without abnormal findings: Secondary | ICD-10-CM | POA: Diagnosis not present

## 2019-07-07 DIAGNOSIS — Z1231 Encounter for screening mammogram for malignant neoplasm of breast: Secondary | ICD-10-CM

## 2019-07-07 LAB — HM DIABETES FOOT EXAM

## 2019-07-07 MED ORDER — PEN NEEDLES 31G X 8 MM MISC: 0 refills | Status: AC

## 2019-07-07 MED ORDER — EZETIMIBE 10 MG PO TABS: 10.0000 mg | ORAL_TABLET | Freq: Every day | ORAL | 11 refills | Status: AC

## 2019-07-07 MED ORDER — TRULICITY 0.75 MG/0.5ML ~~LOC~~ SOAJ: 0.7500 mg | SUBCUTANEOUS | 11 refills | Status: AC

## 2019-07-07 NOTE — Patient Instructions (Addendum)
Follow BP at home.. call if > 140/90.  Try to increase exercise.. 150 min per week.  Work on low fat low chol diet. Stop mayo, cheese and bacon.  Start zetia daily for cholesterol.  We will cal to set up mammogram and bone density.

## 2019-07-07 NOTE — Assessment & Plan Note (Addendum)
Poor control on metfomrin  Max alone Add trulicty weekly. Follow up in 3 months with repeat labs.

## 2019-07-07 NOTE — Addendum Note (Signed)
Addended by: Carter Kitten on: 07/07/2019 02:56 PM   Modules accepted: Orders

## 2019-07-07 NOTE — Progress Notes (Signed)
No critical labs need to be addressed urgently. We will discuss labs in detail at upcoming office visit.   

## 2019-07-07 NOTE — Progress Notes (Signed)
Chief Complaint  Patient presents with  . Annual Exam    History of Present Illness: HPI  The patient is here for annual wellness She is feeling well overall.  Diabetes:   She is using max metfomrin. Lab Results  Component Value Date   HGBA1C 8.1 (H) 07/03/2019  Using medications without difficulties: Hypoglycemic episodes:none Hyperglycemic episodes: Feet problems: no ulcer Blood Sugars averaging:  Not checking. eye exam within last year:  Hypertension:    Borderline control on HCTZ BP Readings from Last 3 Encounters:  07/07/19 140/80  12/04/18 130/80  11/13/18 (!) 150/88  Using medication without problems or lightheadedness: none Chest pain with exertion:none Edema:none Short of breath:none Average home BPs: Not checking. Other issues:  Stable elevated LFTs.  Elevated Cholesterol:  LDL far from goal. On atorvastatin 80 mg daily. Lab Results  Component Value Date   CHOL 292 (H) 07/03/2019   HDL 37.50 (L) 07/03/2019   LDLCALC 153 (H) 11/07/2018   LDLDIRECT 200.0 07/03/2019   TRIG 348.0 (H) 07/03/2019   CHOLHDL 8 07/03/2019  Using medications without problems: Muscle aches:  Diet compliance: moderate. Exercise: not walking any longer Other complaints:  COVID 19 screen No recent travel or known exposure to COVID19 The patient denies respiratory symptoms of COVID 19 at this time.  The importance of social distancing was discussed today.   Review of Systems  Constitutional: Negative for chills and fever.  HENT: Negative for congestion and ear pain.   Eyes: Negative for pain and redness.  Respiratory: Negative for cough and shortness of breath.   Cardiovascular: Negative for chest pain, palpitations and leg swelling.  Gastrointestinal: Negative for abdominal pain, blood in stool, constipation, diarrhea, nausea and vomiting.  Genitourinary: Negative for dysuria.  Musculoskeletal: Negative for falls and myalgias.  Skin: Negative for rash.  Neurological:  Negative for dizziness.  Psychiatric/Behavioral: Negative for depression. The patient is not nervous/anxious.       Past Medical History:  Diagnosis Date  . Fatty liver disease, nonalcoholic 01/26/2012  . Foot fracture, left 2009   Jones fracture- no surgery  . GERD (gastroesophageal reflux disease)   . Hyperlipidemia   . Poorly controlled diabetes mellitus (HCC) 10/04/2011    reports that she has never smoked. She has never used smokeless tobacco. She reports current alcohol use. She reports that she does not use drugs.   Current Outpatient Medications:  .  atorvastatin (LIPITOR) 80 MG tablet, Take 1 tablet (80 mg total) by mouth at bedtime., Disp: 90 tablet, Rfl: 2 .  escitalopram (LEXAPRO) 20 MG tablet, TAKE 1 TABLET(20 MG) BY MOUTH DAILY, Disp: 30 tablet, Rfl: 5 .  metFORMIN (GLUCOPHAGE-XR) 500 MG 24 hr tablet, TAKE 4 TABLETS BY MOUTH DAILY WITH BREAKFAST, Disp: 360 tablet, Rfl: 1 .  pantoprazole (PROTONIX) 40 MG tablet, TAKE 1 TABLET BY MOUTH DAILY, Disp: 90 tablet, Rfl: 0 .  SUMAtriptan (IMITREX) 100 MG tablet, Take 1 tablet by mouth as needed for migraine, may repeat in 2 hours x 1., Disp: 9 tablet, Rfl: 1 .  traMADol (ULTRAM) 50 MG tablet, Take 1-2 tablets (50-100 mg total) by mouth every 6 (six) hours as needed., Disp: 20 tablet, Rfl: 0 .  hydrochlorothiazide (HYDRODIURIL) 25 MG tablet, Take 1 tablet (25 mg total) by mouth daily. (Patient not taking: Reported on 07/07/2019), Disp: 30 tablet, Rfl: 11   Observations/Objective: Blood pressure 140/80, pulse 79, temperature 98.5 F (36.9 C), temperature source Temporal, height 5' 1.75" (1.568 m), weight 166 lb 8 oz (75.5 kg),  SpO2 97 %.  Physical Exam Constitutional:      General: She is not in acute distress.    Appearance: Normal appearance. She is well-developed. She is not ill-appearing or toxic-appearing.  HENT:     Head: Normocephalic.     Right Ear: Hearing, tympanic membrane, ear canal and external ear normal.     Left Ear:  Hearing, tympanic membrane, ear canal and external ear normal.     Nose: Nose normal.  Eyes:     General: Lids are normal. Lids are everted, no foreign bodies appreciated.     Conjunctiva/sclera: Conjunctivae normal.     Pupils: Pupils are equal, round, and reactive to light.  Neck:     Musculoskeletal: Normal range of motion and neck supple.     Thyroid: No thyroid mass or thyromegaly.     Vascular: No carotid bruit.     Trachea: Trachea normal.  Cardiovascular:     Rate and Rhythm: Normal rate and regular rhythm.     Heart sounds: Normal heart sounds, S1 normal and S2 normal. No murmur. No gallop.   Pulmonary:     Effort: Pulmonary effort is normal. No respiratory distress.     Breath sounds: Normal breath sounds. No wheezing, rhonchi or rales.  Abdominal:     General: Bowel sounds are normal. There is no distension or abdominal bruit.     Palpations: Abdomen is soft. There is no fluid wave or mass.     Tenderness: There is no abdominal tenderness. There is no guarding or rebound.     Hernia: No hernia is present.  Genitourinary:    Exam position: Supine.     Labia:        Right: No rash, tenderness or lesion.        Left: No rash, tenderness or lesion.      Vagina: Normal.     Cervix: No cervical motion tenderness, discharge or friability.     Uterus: Not enlarged and not tender.      Adnexa:        Right: No mass, tenderness or fullness.         Left: No mass, tenderness or fullness.    Lymphadenopathy:     Cervical: No cervical adenopathy.  Skin:    General: Skin is warm and dry.     Findings: No rash.  Neurological:     Mental Status: She is alert.     Cranial Nerves: No cranial nerve deficit.     Sensory: No sensory deficit.  Psychiatric:        Mood and Affect: Mood is not anxious or depressed.        Speech: Speech normal.        Behavior: Behavior normal. Behavior is cooperative.        Judgment: Judgment normal.      Diabetic foot exam: Normal  inspection No skin breakdown No calluses  Normal DP pulses Normal sensation to light touch and monofilament Nails normal  Assessment and Plan The patient's preventative maintenance and recommended screening tests for an annual wellness exam were reviewed in full today. Brought up to date unless services declined.  Counselled on the importance of diet, exercise, and its role in overall health and mortality. The patient's FH and SH was reviewed, including their home life, tobacco status, and drug and alcohol status.   Vaccines: recommended flu when available Pap/DVE:  due Mammo: 01/2018 repeat q 2 years Colon: 2010  hemorrhoids, diverticulosis sigmoid,  no polyps, repeat in 10 years   Smoking Status:none ETOH/ drug HAF:BXUX/YBFX  Hep C: neg 2015  HIV screen:   refused       Eliezer Lofts, MD

## 2019-07-07 NOTE — Addendum Note (Signed)
Addended by: Carter Kitten on: 07/07/2019 03:04 PM   Modules accepted: Orders

## 2019-07-07 NOTE — Assessment & Plan Note (Signed)
Poor control despite atorvastatin.. she has high animal fat diet. Add zetia and make lifestyle changes.

## 2019-07-09 LAB — CYTOLOGY - PAP
Diagnosis: NEGATIVE
HPV: NOT DETECTED

## 2019-07-13 ENCOUNTER — Telehealth: Payer: Self-pay | Admitting: Family Medicine

## 2019-07-13 DIAGNOSIS — Z1231 Encounter for screening mammogram for malignant neoplasm of breast: Secondary | ICD-10-CM

## 2019-07-13 NOTE — Telephone Encounter (Signed)
-----   Message from [facility] sent at 07/13/2019  9:01 AM EDT ----- Regarding: [id number] Dr. Diona Browner  Can you please change pt's mammo order to [id number].  [place] requires this codes for all their screening mammos.  Thanks  Charmaine

## 2019-07-28 ENCOUNTER — Other Ambulatory Visit: Payer: Self-pay | Admitting: Family Medicine

## 2019-08-14 ENCOUNTER — Other Ambulatory Visit: Payer: Self-pay | Admitting: *Deleted

## 2019-08-14 MED ORDER — ATORVASTATIN CALCIUM 80 MG PO TABS: 80.0000 mg | ORAL_TABLET | Freq: Every day | ORAL | 3 refills | Status: AC

## 2019-08-25 ENCOUNTER — Other Ambulatory Visit: Payer: Self-pay

## 2019-08-25 ENCOUNTER — Ambulatory Visit
Admission: RE | Admit: 2019-08-25 | Discharge: 2019-08-25 | Disposition: A | Discharge status: Home / Self Care | Payer: 59 | Source: Ambulatory Visit | Attending: Family Medicine | Admitting: Family Medicine

## 2019-08-25 ENCOUNTER — Encounter: Payer: Self-pay | Admitting: Family Medicine

## 2019-08-25 DIAGNOSIS — S32000S Wedge compression fracture of unspecified lumbar vertebra, sequela: Secondary | ICD-10-CM | POA: Diagnosis not present

## 2019-08-25 DIAGNOSIS — Z1231 Encounter for screening mammogram for malignant neoplasm of breast: Secondary | ICD-10-CM | POA: Diagnosis present

## 2019-08-25 DIAGNOSIS — M8580 Other specified disorders of bone density and structure, unspecified site: Secondary | ICD-10-CM | POA: Insufficient documentation

## 2019-08-25 DIAGNOSIS — Z20822 Contact with and (suspected) exposure to covid-19: Secondary | ICD-10-CM

## 2019-08-26 LAB — NOVEL CORONAVIRUS, NAA: SARS-CoV-2, NAA: NOT DETECTED

## 2019-09-18 ENCOUNTER — Other Ambulatory Visit: Payer: Self-pay

## 2019-09-18 DIAGNOSIS — Z20822 Contact with and (suspected) exposure to covid-19: Secondary | ICD-10-CM

## 2019-09-20 LAB — NOVEL CORONAVIRUS, NAA: SARS-CoV-2, NAA: NOT DETECTED

## 2019-10-26 ENCOUNTER — Other Ambulatory Visit: Payer: Self-pay | Admitting: Family Medicine

## 2020-04-19 ENCOUNTER — Encounter: Payer: Self-pay | Admitting: Cardiovascular Disease

## 2020-04-19 ENCOUNTER — Ambulatory Visit (INDEPENDENT_AMBULATORY_CARE_PROVIDER_SITE_OTHER): Payer: Medicare Other | Admitting: Cardiovascular Disease

## 2020-04-19 ENCOUNTER — Other Ambulatory Visit: Payer: Self-pay

## 2020-04-19 VITALS — BP 146/80 | HR 69 | Ht 62.0 in | Wt 167.0 lb

## 2020-04-19 DIAGNOSIS — E782 Mixed hyperlipidemia: Secondary | ICD-10-CM

## 2020-04-19 DIAGNOSIS — E1159 Type 2 diabetes mellitus with other circulatory complications: Secondary | ICD-10-CM

## 2020-04-19 DIAGNOSIS — M79604 Pain in right leg: Secondary | ICD-10-CM | POA: Diagnosis not present

## 2020-04-19 DIAGNOSIS — M79605 Pain in left leg: Secondary | ICD-10-CM | POA: Diagnosis not present

## 2020-04-19 DIAGNOSIS — I10 Essential (primary) hypertension: Secondary | ICD-10-CM | POA: Diagnosis not present

## 2020-04-19 DIAGNOSIS — I152 Hypertension secondary to endocrine disorders: Secondary | ICD-10-CM

## 2020-04-19 NOTE — Progress Notes (Signed)
Cardiology Office Note   Date:  04/19/2020   ID:  DARLENY SEM, DOB 1956-02-22, MRN [id number]  PCP:  Kandyce Rud, MD  Cardiologist:   Lorine Bears, MD   Chief Complaint  Patient presents with  . New Patient (Initial Visit)    Self referral for bilateral leg pain. meds reviewed verbally with patient.       History of Present Illness: Karla Peterson is a 64 y.o. female who is self-referred for evaluation of bilateral leg pain and possible peripheral arterial disease.  She has no prior cardiac history.  She had coronary calcium score done in 2018 which was normal with no evidence of atherosclerosis.  She has known history of uncontrolled diabetes mellitus diagnosed 2 to 3 years ago, hyperlipidemia and hypertension.  She is not a smoker and has no family history of coronary artery disease. She reports prolonged symptoms of bilateral calf and knee discomfort which is more noticeable at the end of the day when she is resting.  Symptoms get better with a heating pad.  She has no lower extremity ulceration.  No chest pain or shortness of breath.    Past Medical History:  Diagnosis Date  . Fatty liver disease, nonalcoholic 01/26/2012  . Foot fracture, left 2009   Jones fracture- no surgery  . GERD (gastroesophageal reflux disease)   . Hyperlipidemia   . Poorly controlled diabetes mellitus (HCC) 10/04/2011    Past Surgical History:  Procedure Laterality Date  . BACK SURGERY  2001   herniated disk, lumbar spine  . BREAST BIOPSY Right neg   2013  . TONSILLECTOMY       Current Outpatient Medications  Medication Sig Dispense Refill  . atorvastatin (LIPITOR) 80 MG tablet Take 1 tablet (80 mg total) by mouth at bedtime. 90 tablet 3  . Dulaglutide (TRULICITY) 0.75 MG/0.5ML SOPN Inject 0.75 mg into the skin once a week. 3 mL 11  . escitalopram (LEXAPRO) 20 MG tablet TAKE 1 TABLET(20 MG) BY MOUTH DAILY 90 tablet 1  . Insulin Pen Needle (PEN NEEDLES) 31G X 8 MM MISC Use to  inject Trulicity weekly. 50 each 0  . metFORMIN (GLUCOPHAGE-XR) 500 MG 24 hr tablet TAKE 4 TABLETS BY MOUTH DAILY WITH BREAKFAST 360 tablet 1  . pantoprazole (PROTONIX) 40 MG tablet TAKE 1 TABLET BY MOUTH DAILY 90 tablet 1  . SUMAtriptan (IMITREX) 100 MG tablet Take 1 tablet by mouth as needed for migraine, may repeat in 2 hours x 1. 9 tablet 1  . traMADol (ULTRAM) 50 MG tablet Take 1-2 tablets (50-100 mg total) by mouth every 6 (six) hours as needed. 20 tablet 0  . ezetimibe (ZETIA) 10 MG tablet Take 1 tablet (10 mg total) by mouth daily. (Patient not taking: Reported on 04/19/2020) 30 tablet 11  . hydrochlorothiazide (HYDRODIURIL) 25 MG tablet Take 1 tablet (25 mg total) by mouth daily. (Patient not taking: Reported on 07/07/2019) 30 tablet 11   No current facility-administered medications for this visit.    Allergies:   Patient has no known allergies.    Social History:  The patient  reports that she has never smoked. She has never used smokeless tobacco. She reports current alcohol use. She reports that she does not use drugs.   Family History:  The patient's family history includes Aneurysm in her father; Breast cancer in her mother; Heart disease in her father; Liver disease in her father.    ROS:  Please see the history of present  illness.   Otherwise, review of systems are positive for none.   All other systems are reviewed and negative.    PHYSICAL EXAM: VS:  BP (!) 146/80 (BP Location: Right Arm, Patient Position: Sitting, Cuff Size: Normal)   Pulse 69   Ht 5\' 2"  (1.575 m)   Wt 167 lb (75.8 kg)   SpO2 96%   BMI 30.54 kg/m  , BMI Body mass index is 30.54 kg/m. GEN: Well nourished, well developed, in no acute distress  HEENT: normal  Neck: no JVD, carotid bruits, or masses Cardiac: RRR; no murmurs, rubs, or gallops,no edema  Respiratory:  clear to auscultation bilaterally, normal work of breathing GI: soft, nontender, nondistended, + BS MS: no deformity or atrophy  Skin:  warm and dry, no rash Neuro:  Strength and sensation are intact Psych: euthymic mood, full affect Vascular: Femoral pulses +2 bilaterally with no bruits.  Dorsalis pedis and posterior tibialis +2.   EKG:  EKG is ordered today. The ekg ordered today demonstrates normal sinus rhythm with no significant ST or T wave changes.   Recent Labs: 07/03/2019: ALT 98; BUN 12; Creatinine, Ser 0.57; Potassium 4.4; Sodium 137    Lipid Panel    Component Value Date/Time   CHOL 292 (H) 07/03/2019 0829   TRIG 348.0 (H) 07/03/2019 0829   HDL 37.50 (L) 07/03/2019 0829   CHOLHDL 8 07/03/2019 0829   VLDL 69.6 (H) 07/03/2019 0829   LDLCALC 153 (H) 11/07/2018 1143   LDLDIRECT 200.0 07/03/2019 0829      Wt Readings from Last 3 Encounters:  04/19/20 167 lb (75.8 kg)  07/07/19 166 lb 8 oz (75.5 kg)  12/04/18 170 lb 12 oz (77.5 kg)       No flowsheet data found.    ASSESSMENT AND PLAN:  1.  Bilateral leg pain: Symptoms are not consistent with classic claudications.  In addition, her pulses are relatively normal.  She does have uncontrolled diabetes and certainly she is at risk of peripheral arterial disease.  I requested lower extremity arterial Doppler.  2.  Essential hypertension: Blood pressure is reasonably controlled.  3.  Hyperlipidemia: She has known history of severe hyperlipidemia and she is currently on high-dose atorvastatin and Zetia.    Disposition:   FU with me as needed if testing is abnormal.  Signed,  Karla Sacramento, MD  04/19/2020 2:07 PM    [place]

## 2020-04-19 NOTE — Patient Instructions (Signed)
Medication Instructions:  Your physician recommends that you continue on your current medications as directed. Please refer to the Current Medication list given to you today.  *If you need a refill on your cardiac medications before your next appointment, please call your pharmacy*   Lab Work: none If you have labs (blood work) drawn today and your tests are completely normal, you will receive your results only by: Marland Kitchen MyChart Message (if you have MyChart) OR . A paper copy in the mail If you have any lab test that is abnormal or we need to change your treatment, we will call you to review the results.   Testing/Procedures: Your physician has requested that you have a lower extremity arterial doppler- During this test, ultrasound is used to evaluate arterial blood flow in the legs. Allow approximately one hour for this exam.     Follow-Up: At [facility], you and your health needs are our priority.  As part of our continuing mission to provide you with exceptional heart care, we have created designated Provider Care Teams.  These Care Teams include your primary Cardiologist (physician) and Advanced Practice Providers (APPs -  Physician Assistants and Nurse Practitioners) who all work together to provide you with the care you need, when you need it.  We recommend signing up for the patient portal called "MyChart".  Sign up information is provided on this After Visit Summary.  MyChart is used to connect with patients for Virtual Visits (Telemedicine).  Patients are able to view lab/test results, encounter notes, upcoming appointments, etc.  Non-urgent messages can be sent to your provider as well.   To learn more about what you can do with MyChart, go to [email or web address].    Your next appointment:   As needed.  The format for your next appointment:   In Person  Provider:    You may see Dr Lorine Bears or one of the following Advanced Practice Providers on your designated Care  Team:    Nicolasa Ducking, NP  Eula Listen, PA-C  Marisue Ivan, PA-C

## 2020-05-18 ENCOUNTER — Other Ambulatory Visit: Payer: Self-pay | Admitting: Cardiovascular Disease

## 2020-05-18 DIAGNOSIS — M79605 Pain in left leg: Secondary | ICD-10-CM

## 2020-05-18 DIAGNOSIS — M79604 Pain in right leg: Secondary | ICD-10-CM

## 2020-06-27 ENCOUNTER — Other Ambulatory Visit: Payer: Self-pay

## 2020-06-27 ENCOUNTER — Ambulatory Visit (INDEPENDENT_AMBULATORY_CARE_PROVIDER_SITE_OTHER): Payer: Medicare Other

## 2020-06-27 DIAGNOSIS — M79605 Pain in left leg: Secondary | ICD-10-CM | POA: Diagnosis not present

## 2020-06-27 DIAGNOSIS — M79604 Pain in right leg: Secondary | ICD-10-CM | POA: Diagnosis not present

## 2020-07-31 ENCOUNTER — Other Ambulatory Visit: Payer: Self-pay | Admitting: Family Medicine

## 2020-09-23 ENCOUNTER — Other Ambulatory Visit: Payer: Self-pay | Admitting: Physical Medicine and Rehabilitation

## 2020-09-23 DIAGNOSIS — M5442 Lumbago with sciatica, left side: Secondary | ICD-10-CM

## 2020-10-11 ENCOUNTER — Other Ambulatory Visit: Payer: Self-pay

## 2020-10-11 ENCOUNTER — Ambulatory Visit
Admission: RE | Admit: 2020-10-11 | Discharge: 2020-10-11 | Disposition: A | Discharge status: Home / Self Care | Payer: Medicare Other | Source: Ambulatory Visit | Attending: Physical Medicine and Rehabilitation | Admitting: Physical Medicine and Rehabilitation

## 2020-10-11 DIAGNOSIS — M5442 Lumbago with sciatica, left side: Secondary | ICD-10-CM | POA: Diagnosis not present

## 2021-10-05 ENCOUNTER — Other Ambulatory Visit: Payer: Self-pay | Admitting: Physician Assistant

## 2021-10-05 DIAGNOSIS — M7541 Impingement syndrome of right shoulder: Secondary | ICD-10-CM

## 2021-10-17 ENCOUNTER — Ambulatory Visit
Admission: RE | Admit: 2021-10-17 | Discharge: 2021-10-17 | Disposition: A | Discharge status: Home / Self Care | Payer: Medicare Other | Source: Ambulatory Visit | Attending: Physician Assistant | Admitting: Physician Assistant

## 2021-10-17 ENCOUNTER — Other Ambulatory Visit: Payer: Self-pay

## 2021-10-17 DIAGNOSIS — M7541 Impingement syndrome of right shoulder: Secondary | ICD-10-CM | POA: Diagnosis not present

## 2021-11-02 ENCOUNTER — Other Ambulatory Visit: Payer: Self-pay | Admitting: Family Medicine

## 2021-11-02 DIAGNOSIS — Z1231 Encounter for screening mammogram for malignant neoplasm of breast: Secondary | ICD-10-CM

## 2022-01-15 ENCOUNTER — Other Ambulatory Visit: Payer: Self-pay

## 2022-01-15 ENCOUNTER — Ambulatory Visit
Admission: RE | Admit: 2022-01-15 | Discharge: 2022-01-15 | Disposition: A | Discharge status: Home / Self Care | Payer: Medicare Other | Source: Ambulatory Visit | Attending: Family Medicine | Admitting: Family Medicine

## 2022-01-15 DIAGNOSIS — Z1231 Encounter for screening mammogram for malignant neoplasm of breast: Secondary | ICD-10-CM | POA: Diagnosis not present

## 2022-02-15 ENCOUNTER — Other Ambulatory Visit: Payer: Self-pay | Admitting: Gastroenterology

## 2022-02-15 DIAGNOSIS — R1110 Vomiting, unspecified: Secondary | ICD-10-CM

## 2022-02-15 DIAGNOSIS — R6881 Early satiety: Secondary | ICD-10-CM

## 2022-02-23 ENCOUNTER — Ambulatory Visit
Admission: RE | Admit: 2022-02-23 | Discharge: 2022-02-23 | Disposition: A | Discharge status: Home / Self Care | Payer: Medicare Other | Source: Ambulatory Visit | Attending: Gastroenterology | Admitting: Gastroenterology

## 2022-02-23 DIAGNOSIS — R6881 Early satiety: Secondary | ICD-10-CM | POA: Insufficient documentation

## 2022-02-23 DIAGNOSIS — R1110 Vomiting, unspecified: Secondary | ICD-10-CM | POA: Insufficient documentation

## 2022-02-23 MED ORDER — TECHNETIUM TC 99M SULFUR COLLOID
2.1400 | Freq: Once | INTRAVENOUS | Status: AC
  Administered 2022-02-23: 2.14 via ORAL

## 2022-03-16 ENCOUNTER — Encounter
Admission: RE | Admit: 2022-03-16 | Discharge: 2022-03-16 | Disposition: A | Discharge status: Home / Self Care | Payer: Medicare Other | Source: Ambulatory Visit | Attending: Gastroenterology | Admitting: Gastroenterology

## 2022-03-16 DIAGNOSIS — R6881 Early satiety: Secondary | ICD-10-CM | POA: Insufficient documentation

## 2022-03-16 DIAGNOSIS — R1110 Vomiting, unspecified: Secondary | ICD-10-CM | POA: Diagnosis present

## 2022-03-16 MED ORDER — TECHNETIUM TC 99M SULFUR COLLOID
2.0000 | Freq: Once | INTRAVENOUS | Status: AC | PRN
  Administered 2022-03-16: 2.06 via INTRAVENOUS

## 2022-04-19 ENCOUNTER — Ambulatory Visit (LOCAL_COMMUNITY_HEALTH_CENTER): Payer: Medicare Other

## 2022-04-19 DIAGNOSIS — Z23 Encounter for immunization: Secondary | ICD-10-CM | POA: Diagnosis not present

## 2022-04-19 DIAGNOSIS — Z719 Counseling, unspecified: Secondary | ICD-10-CM

## 2022-04-19 NOTE — Progress Notes (Signed)
  Are you feeling sick today? No   Have you ever received a dose of COVID-19 Vaccine? [employer], [place], [place], [place], Other) Yes  If yes, which vaccine and how many doses?   Datil 4   Did you bring the vaccination record card or other documentation?  Yes   Do you have a health condition or are undergoing treatment that makes you moderately or severely immunocompromised? This would include, but not be limited to: cancer, HIV, organ transplant, immunosuppressive therapy/high-dose corticosteroids, or moderate/severe primary immunodeficiency.  No  Have you received COVID-19 vaccine before or during hematopoietic cell transplant (HCT) or CAR-T-cell therapies? No  Have you ever had an allergic reaction to: (This would include a severe allergic reaction or a reaction that caused hives, swelling, or respiratory distress, including wheezing.) A component of a COVID-19 vaccine or a previous dose of COVID-19 vaccine? No   Have you ever had an allergic reaction to another vaccine (other thanCOVID-19 vaccine) or an injectable medication? (This would include a severe allergic reaction or a reaction that caused hives, swelling, or respiratory distress, including wheezing.)   No    Do you have a history of any of the following:  Myocarditis or Pericarditis No  Dermal fillers:  No  Multisystem Inflammatory Syndrome (MIS-C or MIS-A)? No  COVID-19 disease within the past 3 months? No  Vaccinated with monkeypox vaccine in the last 4 weeks? No  Administered Pfizer BV vaccine to left deltoid, tolerated well. Given pt a copy of updated [facility] and covid vaccine card. Clent Ridges, LPN

## 2024-01-03 ENCOUNTER — Other Ambulatory Visit: Payer: Self-pay | Admitting: Family Medicine

## 2024-01-03 DIAGNOSIS — Z1231 Encounter for screening mammogram for malignant neoplasm of breast: Secondary | ICD-10-CM

## 2024-01-08 ENCOUNTER — Ambulatory Visit
Admission: RE | Admit: 2024-01-08 | Discharge: 2024-01-08 | Disposition: A | Discharge status: Home / Self Care | Payer: Medicare HMO | Source: Ambulatory Visit | Attending: Family Medicine | Admitting: Family Medicine

## 2024-01-08 DIAGNOSIS — Z1231 Encounter for screening mammogram for malignant neoplasm of breast: Secondary | ICD-10-CM | POA: Insufficient documentation

## 2024-01-14 ENCOUNTER — Other Ambulatory Visit: Payer: Self-pay | Admitting: Family Medicine

## 2024-01-14 DIAGNOSIS — R928 Other abnormal and inconclusive findings on diagnostic imaging of breast: Secondary | ICD-10-CM

## 2024-01-15 ENCOUNTER — Ambulatory Visit
Admission: RE | Admit: 2024-01-15 | Discharge: 2024-01-15 | Disposition: A | Discharge status: Home / Self Care | Payer: Medicare HMO | Source: Ambulatory Visit | Attending: Family Medicine | Admitting: Family Medicine

## 2024-01-15 ENCOUNTER — Ambulatory Visit
Admission: RE | Admit: 2024-01-15 | Discharge: 2024-01-15 | Discharge status: Home / Self Care | Payer: Medicare HMO | Source: Ambulatory Visit | Attending: Family Medicine | Admitting: Family Medicine

## 2024-01-15 DIAGNOSIS — R928 Other abnormal and inconclusive findings on diagnostic imaging of breast: Secondary | ICD-10-CM
